# Patient Record
Sex: Female | Born: 1960 | Race: Black or African American | Hispanic: No | Marital: Single | Smoking: Current every day smoker
Health system: Southern US, Community
[De-identification: ages and names within clinical notes are randomized; demographics above are authoritative.]

## PROBLEM LIST (undated history)

## (undated) DIAGNOSIS — F329 Major depressive disorder, single episode, unspecified: Secondary | ICD-10-CM

## (undated) DIAGNOSIS — H9190 Unspecified hearing loss, unspecified ear: Secondary | ICD-10-CM

## (undated) DIAGNOSIS — J449 Chronic obstructive pulmonary disease, unspecified: Secondary | ICD-10-CM

## (undated) DIAGNOSIS — F419 Anxiety disorder, unspecified: Secondary | ICD-10-CM

## (undated) DIAGNOSIS — F32A Depression, unspecified: Secondary | ICD-10-CM

## (undated) DIAGNOSIS — B20 Human immunodeficiency virus [HIV] disease: Secondary | ICD-10-CM

## (undated) DIAGNOSIS — Z21 Asymptomatic human immunodeficiency virus [HIV] infection status: Secondary | ICD-10-CM

## (undated) HISTORY — DX: Unspecified hearing loss, unspecified ear: H91.90

## (undated) HISTORY — DX: Depression, unspecified: F32.A

## (undated) HISTORY — DX: Anxiety disorder, unspecified: F41.9

## (undated) HISTORY — DX: Major depressive disorder, single episode, unspecified: F32.9

---

## 2016-08-03 ENCOUNTER — Emergency Department: Payer: Medicare HMO

## 2016-08-03 ENCOUNTER — Emergency Department
Admission: EM | Admit: 2016-08-03 | Discharge: 2016-08-03 | Disposition: A | Discharge status: Home / Self Care | Payer: Medicare HMO | Attending: Student in an Organized Health Care Education/Training Program | Admitting: Student in an Organized Health Care Education/Training Program

## 2016-08-03 DIAGNOSIS — R079 Chest pain, unspecified: Secondary | ICD-10-CM

## 2016-08-03 DIAGNOSIS — J209 Acute bronchitis, unspecified: Secondary | ICD-10-CM

## 2016-08-03 DIAGNOSIS — R0602 Shortness of breath: Secondary | ICD-10-CM | POA: Diagnosis present

## 2016-08-03 DIAGNOSIS — J440 Chronic obstructive pulmonary disease with acute lower respiratory infection: Secondary | ICD-10-CM | POA: Diagnosis not present

## 2016-08-03 LAB — CBC
HCT: 37.7 % (ref 35.0–47.0)
Hemoglobin: 12.8 g/dL (ref 12.0–16.0)
MCH: 31.5 pg (ref 26.0–34.0)
MCHC: 33.9 g/dL (ref 32.0–36.0)
MCV: 92.8 fL (ref 80.0–100.0)
PLATELETS: 245 10*3/uL (ref 150–440)
RBC: 4.06 MIL/uL (ref 3.80–5.20)
RDW: 13.9 % (ref 11.5–14.5)
WBC: 5.8 10*3/uL (ref 3.6–11.0)

## 2016-08-03 LAB — BASIC METABOLIC PANEL
Anion gap: 5 (ref 5–15)
BUN: 10 mg/dL (ref 6–20)
CHLORIDE: 103 mmol/L (ref 101–111)
CO2: 27 mmol/L (ref 22–32)
CREATININE: 0.69 mg/dL (ref 0.44–1.00)
Calcium: 9.1 mg/dL (ref 8.9–10.3)
Glucose, Bld: 94 mg/dL (ref 65–99)
POTASSIUM: 4.2 mmol/L (ref 3.5–5.1)
SODIUM: 135 mmol/L (ref 135–145)

## 2016-08-03 LAB — TROPONIN I: Troponin I: <0.03 ng/mL (ref <0.03)

## 2016-08-03 IMAGING — CT CT ANGIO CHEST
2 of 6 series · 19 of 46 positions shown · IV contrast (APPLIED)
Comparison: None.

CLINICAL DATA: Chest pain for 2 days

EXAM:
CT ANGIOGRAPHY CHEST WITH CONTRAST
TECHNIQUE: Multidetector CT imaging of the chest was performed using the
standard protocol during bolus administration of intravenous
contrast. Multiplanar CT image reconstructions and MIPs were
obtained to evaluate the vascular anatomy.
CONTRAST:  75 cc of Isovue 370

[Series 5: thins · axial · 0.59mm/px · z∈[-673,-417]mm · 17 of 282 slices shown]
[im 13/282  lung]
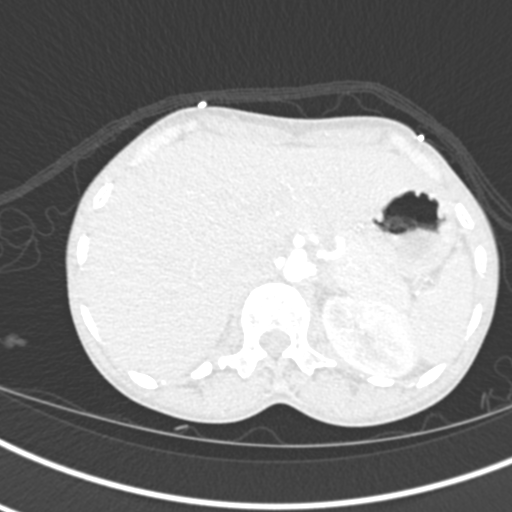
[im 25/282  soft-tissue]
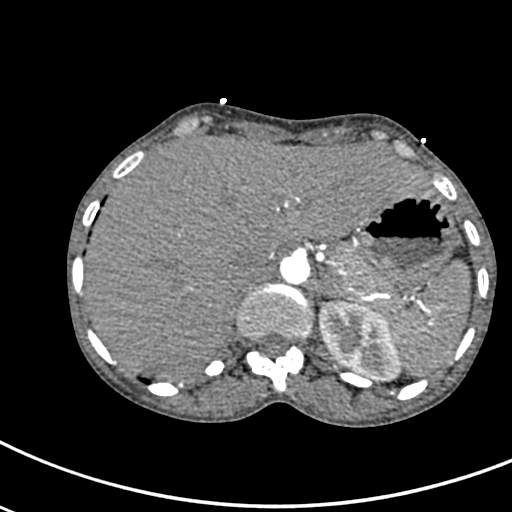
[im 49/282  lung]
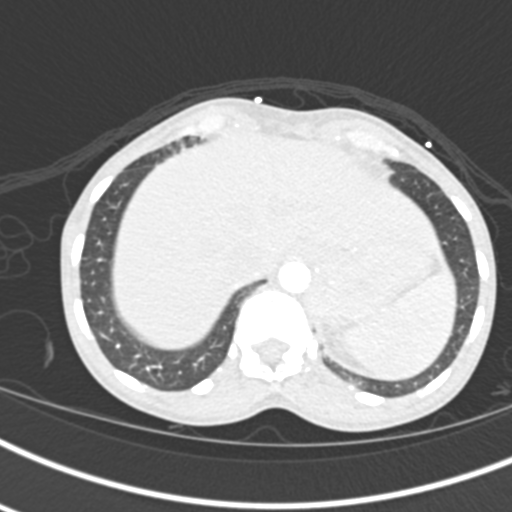
[im 62/282  soft-tissue]
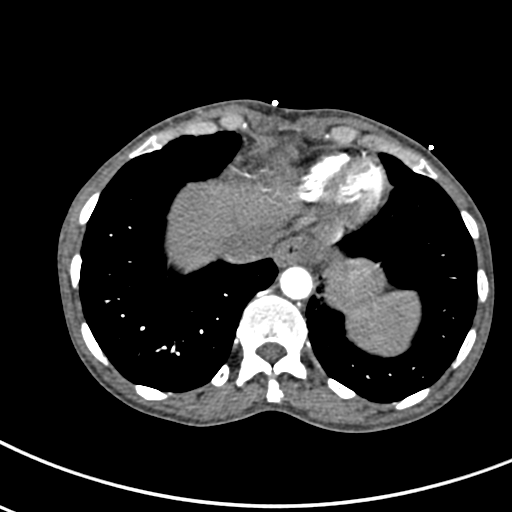
[im 74/282  lung]
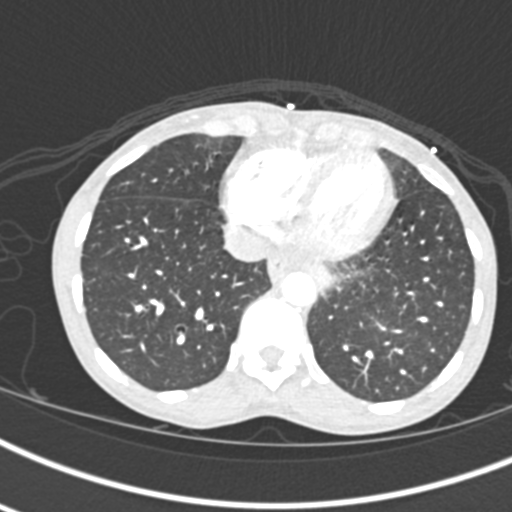
[im 98/282  soft-tissue]
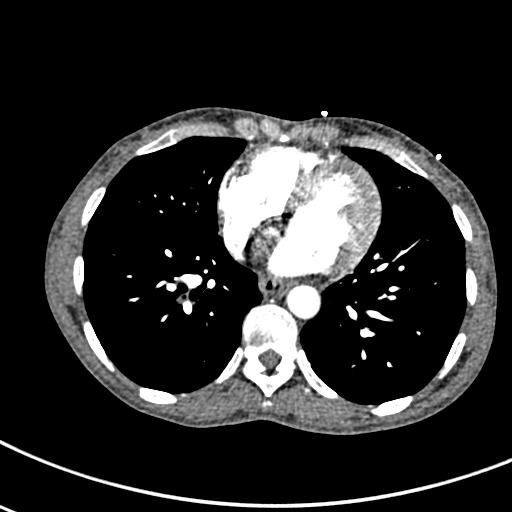
[im 110/282  lung]
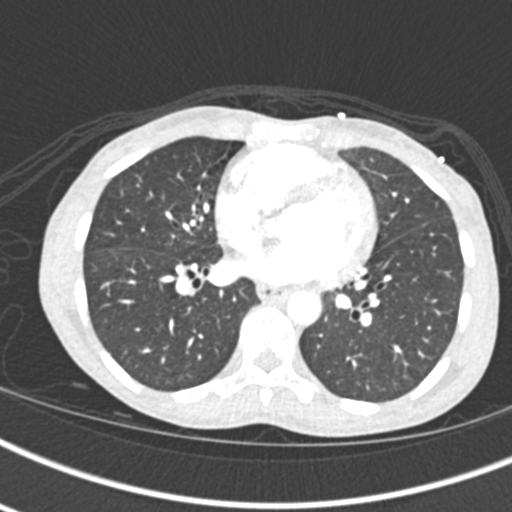
[im 123/282  soft-tissue]
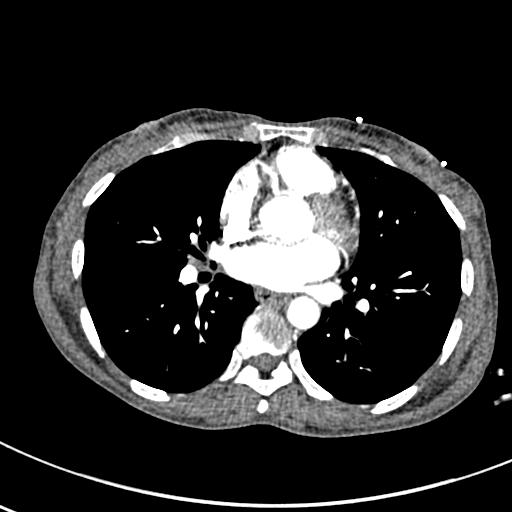
[im 147/282  lung]
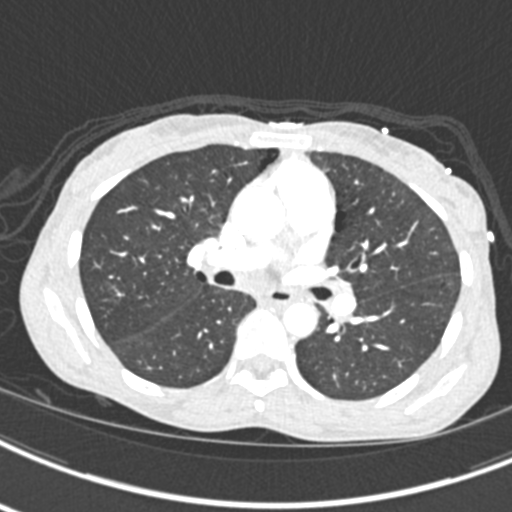
[im 159/282  soft-tissue]
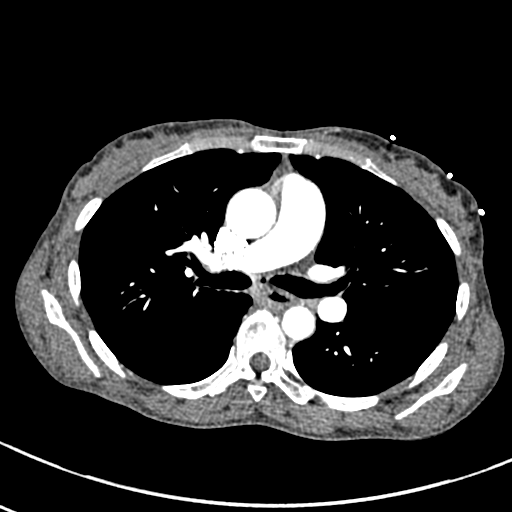
[im 172/282  lung]
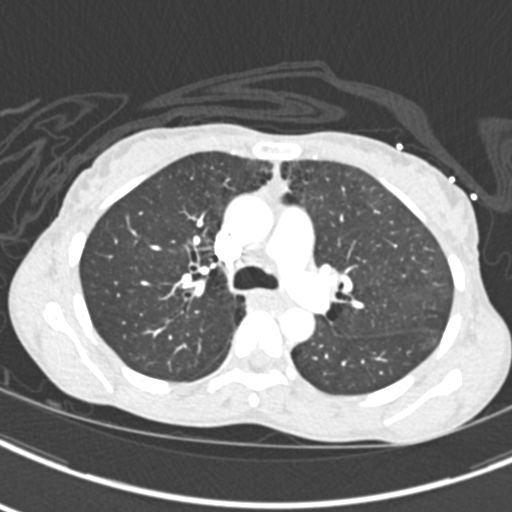
[im 184/282  soft-tissue]
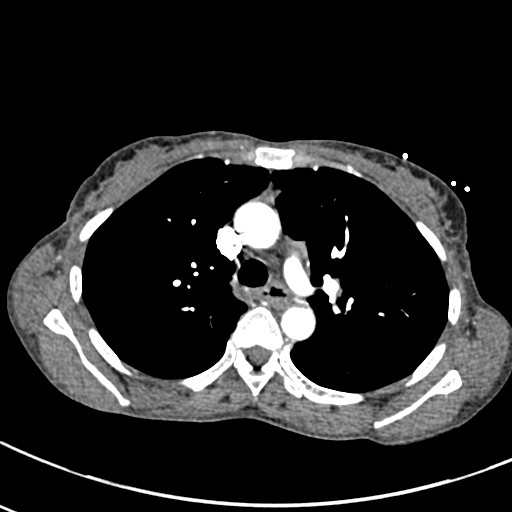
[im 208/282  lung]
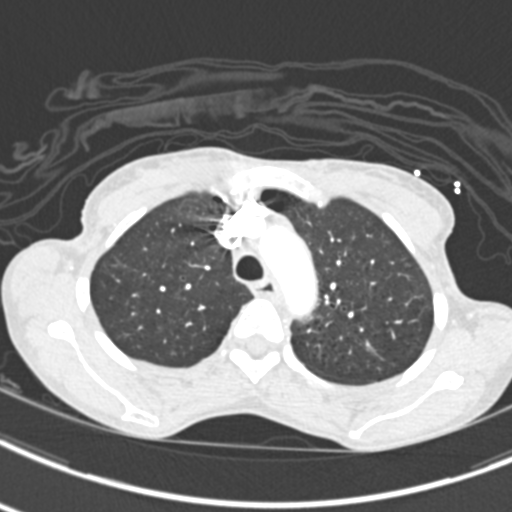
[im 220/282  soft-tissue]
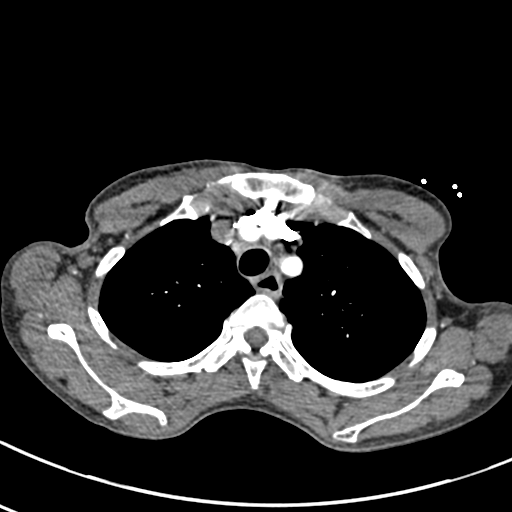
[im 233/282  lung]
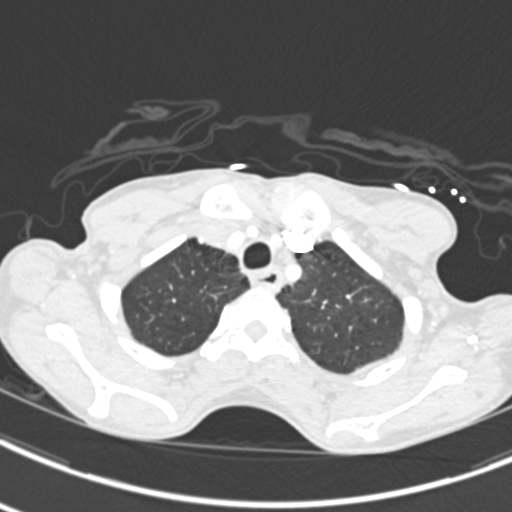
[im 257/282  soft-tissue]
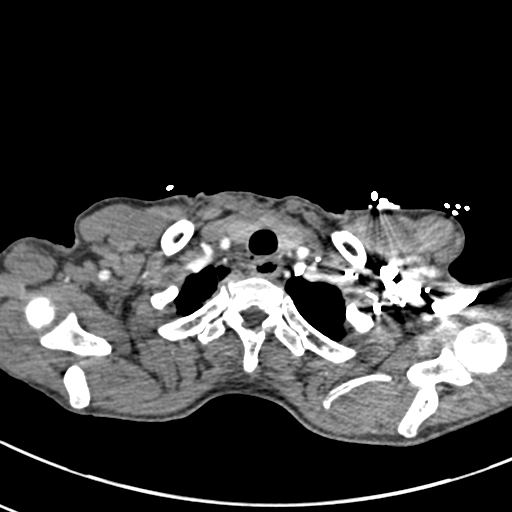
[im 269/282  lung]
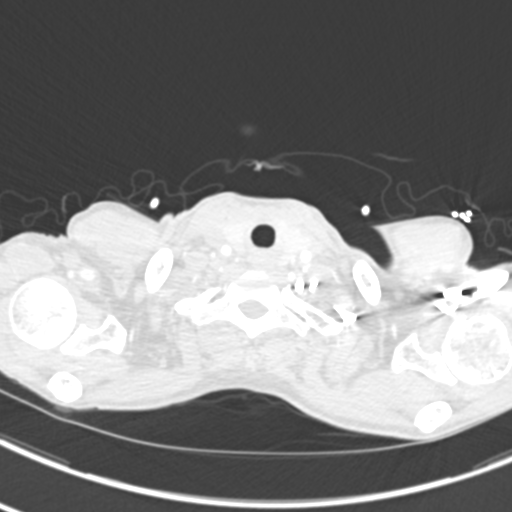

[Series 7: coronal mpr · coronal · 0.53mm/px · 2 of 67 slices shown]
[im 23/67  soft-tissue]
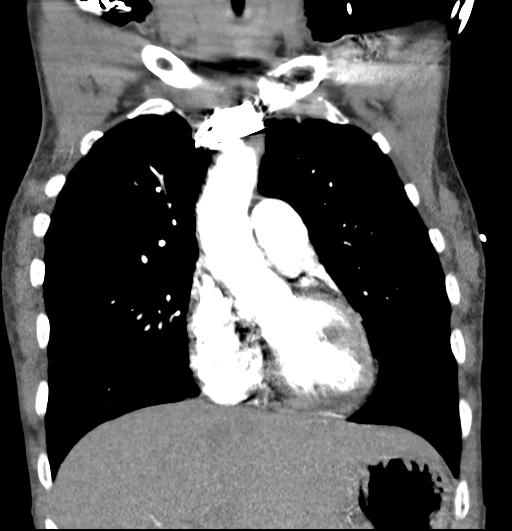
[im 45/67  soft-tissue]
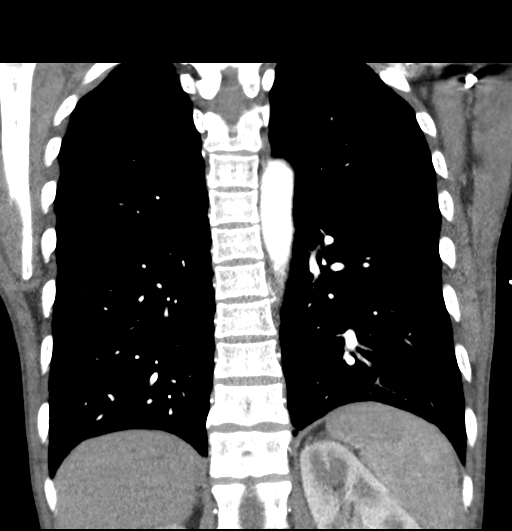

[19 of 46 positions shown; findings below may reference images not displayed]

FINDINGS: Cardiovascular: Satisfactory opacification of the pulmonary arteries
to the segmental level. No evidence of pulmonary embolism. Normal
heart size. No pericardial effusion.

Mediastinum/Nodes: The trachea appears patent and is midline. Normal
appearance of the esophagus. No mediastinal or hilar adenopathy. No
axillary or supraclavicular adenopathy. Within the posterior
mediastinum there is a 1.1 x 1.55 1.7 cm soft tissue attenuating
nodular structure, image 69 of series 4 and image number 53 of
series 9. This may represent a subpleural nodule or posterior
mediastinal lymph node.

Lungs/Pleura: No pleural fluid identified. Mild changes of
centrilobular and paraseptal emphysema. No airspace consolidation.
No pulmonary edema. Mild diffuse bronchial wall thickening.

Upper Abdomen: No acute abnormality.

Musculoskeletal: Evidence of avascular necrosis within both humeral
heads noted.

Review of the MIP images confirms the above findings.
IMPRESSION: 1. No evidence for acute pulmonary embolus.
2. Diffuse bronchial wall thickening with emphysema, as above;
imaging findings suggestive of underlying COPD.
3. There is an indeterminate soft tissue attenuating structure
within the posterior mediastinum. Although this may represent a
benign abnormality, given the patient's risk factors as well as 40
pound weight loss further investigation with PET-CT is advised to
assess for underlying malignancy.
4. Avascular necrosis involves both humeral heads

## 2016-08-03 MED ORDER — ALBUTEROL SULFATE HFA 108 (90 BASE) MCG/ACT IN AERS: 2.0000 | INHALATION_SPRAY | Freq: Four times a day (QID) | RESPIRATORY_TRACT | 2 refills | Status: DC | PRN

## 2016-08-03 MED ORDER — METHYLPREDNISOLONE SODIUM SUCC 125 MG IJ SOLR
125.0000 mg | Freq: Once | INTRAMUSCULAR | Status: AC
  Administered 2016-08-03: 125 mg via INTRAVENOUS
  Filled 2016-08-03: qty 2

## 2016-08-03 MED ORDER — PREDNISONE 20 MG PO TABS: 40.0000 mg | ORAL_TABLET | Freq: Every day | ORAL | 0 refills | Status: AC

## 2016-08-03 MED ORDER — IPRATROPIUM-ALBUTEROL 0.5-2.5 (3) MG/3ML IN SOLN
3.0000 mL | Freq: Once | RESPIRATORY_TRACT | Status: AC
  Administered 2016-08-03: 3 mL via RESPIRATORY_TRACT
  Filled 2016-08-03: qty 3

## 2016-08-03 MED ORDER — IOPAMIDOL (ISOVUE-370) INJECTION 76%
75.0000 mL | Freq: Once | INTRAVENOUS | Status: AC | PRN
  Administered 2016-08-03: 75 mL via INTRAVENOUS

## 2016-08-03 MED ORDER — FENTANYL CITRATE (PF) 100 MCG/2ML IJ SOLN: 100.0000 ug | INTRAMUSCULAR | Status: DC | PRN

## 2016-08-03 MED ORDER — SODIUM CHLORIDE 0.9 % IV BOLUS (SEPSIS)
1000.0000 mL | Freq: Once | INTRAVENOUS | Status: AC
  Administered 2016-08-03: 1000 mL via INTRAVENOUS

## 2016-08-03 MED ORDER — ALBUTEROL SULFATE (2.5 MG/3ML) 0.083% IN NEBU
5.0000 mg | INHALATION_SOLUTION | Freq: Once | RESPIRATORY_TRACT | Status: AC
  Administered 2016-08-03: 5 mg via RESPIRATORY_TRACT
  Filled 2016-08-03: qty 6

## 2016-08-03 NOTE — ED Provider Notes (Signed)
[facility] Emergency Department Provider Note    None    (approximate)  I have reviewed the triage vital signs and the nursing notes.   HISTORY  Chief Complaint Chest Pain    HPI Patricia Luna is a 55 y.o. female with no previous history of known heart disease or lung disease presents with shortness of breath and chest pain that acutely worsened while at church today. Patient states his symptoms have been worsening over the past 3 days. States that she was at church felt overwhelmed with worsening shortness of breath and inability to catch her breath. States she had a pressure in the middle of her chest without radiation through to her back. She was not diaphoretic and there is no nausea. She does have smoking history.  His neuro and told that she has a diagnosis of COPD or asthma. No previous history of blood clots or pulmonary embolism. No known cancer.   No known history of COPD. No family history of sickle cell or bleeding disorders No recent surgery There are no active problems to display for this patient.     Prior to Admission medications   Medication Sig Start Date End Date Taking? Authorizing Provider  albuterol (PROVENTIL HFA;VENTOLIN HFA) 108 (90 Base) MCG/ACT inhaler Inhale 2 puffs into the lungs every 6 (six) hours as needed for wheezing or shortness of breath. 08/03/16   Willy EddyPatrick Tanay Misuraca, MD  predniSONE (DELTASONE) 20 MG tablet Take 2 tablets (40 mg total) by mouth daily. 08/03/16 08/08/16  Willy EddyPatrick Aniken Monestime, MD    Allergies Patient has no known allergies.    Social History Social History  Substance Use Topics  . Smoking status: Not on file  . Smokeless tobacco: Not on file  . Alcohol use Not on file    Review of Systems Patient denies headaches, rhinorrhea, blurry vision, numbness, shortness of breath, chest pain, edema, cough, abdominal pain, nausea, vomiting, diarrhea, dysuria, fevers, rashes or hallucinations unless  otherwise stated above in HPI. ____________________________________________   PHYSICAL EXAM:  VITAL SIGNS: Vitals:   08/03/16 1330 08/03/16 1400  BP: (!) 126/104 120/74  Pulse: (!) 102 (!) 112  Resp: (!) 28 (!) 24  Temp:      Constitutional: Alert and oriented. Anxious appearing in no acute distress. Eyes: Conjunctivae are normal. PERRL. EOMI. Head: Atraumatic. Nose: No congestion/rhinnorhea. Mouth/Throat: Mucous membranes are moist.  Oropharynx non-erythematous. Neck: No stridor. Painless ROM. No cervical spine tenderness to palpation Hematological/Lymphatic/Immunilogical: No cervical lymphadenopathy. Cardiovascular: Normal rate, regular rhythm. Grossly normal heart sounds.  Good peripheral circulation. Respiratory: Tachypnea with prolonged expiratory phase and diminished breath sounds bilaterally Gastrointestinal: Soft and nontender. No distention. No abdominal bruits. No CVA tenderness.  Musculoskeletal: No lower extremity tenderness nor edema.  No joint effusions. Neurologic:  Normal speech and language. No gross focal neurologic deficits are appreciated. No gait instability. Skin:  Skin is warm, dry and intact. No rash noted. Psychiatric: Mood and affect are normal. Speech and behavior are normal.  ____________________________________________   LABS (all labs ordered are listed, but only abnormal results are displayed)  Results for orders placed or performed during the hospital encounter of 08/03/16 (from the past 24 hour(s))  Basic metabolic panel     Status: None   Collection Time: 08/03/16 11:44 AM  Result Value Ref Range   Sodium 135 135 - 145 mmol/L   Potassium 4.2 3.5 - 5.1 mmol/L   Chloride 103 101 - 111 mmol/L   CO2 27 22 - 32 mmol/L  Glucose, Bld 94 65 - 99 mg/dL   BUN 10 6 - 20 mg/dL   Creatinine, Ser 1.61 0.44 - 1.00 mg/dL   Calcium 9.1 8.9 - 09.6 mg/dL   GFR calc non Af Amer >60 >60 mL/min   GFR calc Af Amer >60 >60 mL/min   Anion gap 5 5 - 15  CBC      Status: None   Collection Time: 08/03/16 11:44 AM  Result Value Ref Range   WBC 5.8 3.6 - 11.0 K/uL   RBC 4.06 3.80 - 5.20 MIL/uL   Hemoglobin 12.8 12.0 - 16.0 g/dL   HCT 04.5 40.9 - 81.1 %   MCV 92.8 80.0 - 100.0 fL   MCH 31.5 26.0 - 34.0 pg   MCHC 33.9 32.0 - 36.0 g/dL   RDW 91.4 78.2 - 95.6 %   Platelets 245 150 - 440 K/uL  Troponin I     Status: None   Collection Time: 08/03/16 11:44 AM  Result Value Ref Range   Troponin I <0.03 <0.03 ng/mL  Troponin I     Status: None   Collection Time: 08/03/16  2:10 PM  Result Value Ref Range   Troponin I <0.03 <0.03 ng/mL   ____________________________________________  EKG My review and personal interpretation at Time: 11:39   Indication: chest pain  Rate: 110  Rhythm: sinus Axis: normal Other: BAE, non specific ST changes, normal intervals ____________________________________________  RADIOLOGY  I personally reviewed all radiographic images ordered to evaluate for the above acute complaints and reviewed radiology reports and findings.  These findings were personally discussed with the patient.  Please see medical record for radiology report.  ____________________________________________   PROCEDURES  Procedure(s) performed: none Procedures    Critical Care performed: no ____________________________________________   INITIAL IMPRESSION / ASSESSMENT AND PLAN / ED COURSE  Pertinent labs & imaging results that were available during my care of the patient were reviewed by me and considered in my medical decision making (see chart for details).  DDX: renal failure, PE, sepsis, acs, malignancy  Patricia Luna is a 55 y.o. who presents to the ED with Complaint of chest pain and shortness of breath. Patient arrives very anxious appearing but in no acute distress. Exam with diminished breath sounds and occasional wheezing suggesting evidence of COPD though given patient's tachycardia and dyspnea will order CT imaging to  evaluate for PE versus mass. No evidence of acute ischemia on EKG there is tachycardia. Patient is otherwise low risk by heart score based on history. We will plan on getting 2 troponins to further risk stratify.  The patient will be placed on continuous pulse oximetry and telemetry for monitoring.  Laboratory evaluation will be sent to evaluate for the above complaints.     Clinical Course as of Aug 03 1524  Wynelle Link Aug 03, 2016  1338 Patient reassessed and denies any chest pain at this time symptoms significant improvement after albuterol treatments. CT imaging does not show any evidence of mass or pulmonary embolism but does show diffuse bronchial wall thickening concerning for emphysema versus COPD. We'll give IV steroids and reassessed.  [PR]  1411 Patient able to ambulate without distress.  No chest pain or SOB.  Will repeat trop to further risk stratify.  [PR]  1511 Repeat trop negative.  Discussed need for close out patient follow up.  [PR]    Clinical Course User Index [PR] Willy Eddy, MD     ____________________________________________   FINAL CLINICAL IMPRESSION(S) / ED DIAGNOSES  Final diagnoses:  COPD (chronic obstructive pulmonary disease) with acute bronchitis (HCC)  Chest pain, unspecified type      NEW MEDICATIONS STARTED DURING THIS VISIT:  New Prescriptions   ALBUTEROL (PROVENTIL HFA;VENTOLIN HFA) 108 (90 BASE) MCG/ACT INHALER    Inhale 2 puffs into the lungs every 6 (six) hours as needed for wheezing or shortness of breath.   PREDNISONE (DELTASONE) 20 MG TABLET    Take 2 tablets (40 mg total) by mouth daily.     Note:  This document was prepared using Dragon voice recognition software and may include unintentional dictation errors.    Willy EddyPatrick Elizabeht Suto, MD 08/03/16 [phone]

## 2016-08-03 NOTE — ED Notes (Signed)
Pulse ox 99 - 100% walking. Denies pain and sob

## 2016-08-03 NOTE — ED Triage Notes (Signed)
Patient from home via POV. C/P for 2 days. Patient states nothing makes it better or worse however it "just gets bad on its own"

## 2016-09-17 ENCOUNTER — Emergency Department: Payer: Medicare Other

## 2016-09-17 ENCOUNTER — Encounter: Payer: Self-pay | Admitting: Emergency Medicine

## 2016-09-17 ENCOUNTER — Emergency Department
Admission: EM | Admit: 2016-09-17 | Discharge: 2016-09-17 | Disposition: A | Discharge status: Home / Self Care | Payer: Medicare Other | Attending: Emergency Medicine | Admitting: Emergency Medicine

## 2016-09-17 DIAGNOSIS — J449 Chronic obstructive pulmonary disease, unspecified: Secondary | ICD-10-CM | POA: Diagnosis not present

## 2016-09-17 DIAGNOSIS — R0602 Shortness of breath: Secondary | ICD-10-CM | POA: Diagnosis not present

## 2016-09-17 DIAGNOSIS — Z87891 Personal history of nicotine dependence: Secondary | ICD-10-CM | POA: Diagnosis not present

## 2016-09-17 DIAGNOSIS — R0600 Dyspnea, unspecified: Secondary | ICD-10-CM

## 2016-09-17 HISTORY — DX: Human immunodeficiency virus (HIV) disease: B20

## 2016-09-17 HISTORY — DX: Asymptomatic human immunodeficiency virus (hiv) infection status: Z21

## 2016-09-17 HISTORY — DX: Chronic obstructive pulmonary disease, unspecified: J44.9

## 2016-09-17 LAB — CBC
HCT: 39.2 % (ref 35.0–47.0)
HEMOGLOBIN: 12.9 g/dL (ref 12.0–16.0)
MCH: 31.1 pg (ref 26.0–34.0)
MCHC: 33 g/dL (ref 32.0–36.0)
MCV: 94.2 fL (ref 80.0–100.0)
Platelets: 303 10*3/uL (ref 150–440)
RBC: 4.16 MIL/uL (ref 3.80–5.20)
RDW: 13.5 % (ref 11.5–14.5)
WBC: 6.6 10*3/uL (ref 3.6–11.0)

## 2016-09-17 LAB — BASIC METABOLIC PANEL
ANION GAP: 8 (ref 5–15)
BUN: 12 mg/dL (ref 6–20)
CHLORIDE: 105 mmol/L (ref 101–111)
CO2: 23 mmol/L (ref 22–32)
Calcium: 8.9 mg/dL (ref 8.9–10.3)
Creatinine, Ser: 0.72 mg/dL (ref 0.44–1.00)
GFR calc non Af Amer: >60 mL/min (ref >60)
Glucose, Bld: 88 mg/dL (ref 65–99)
POTASSIUM: 4.3 mmol/L (ref 3.5–5.1)
Sodium: 136 mmol/L (ref 135–145)

## 2016-09-17 NOTE — ED Triage Notes (Signed)
Pt here because has lost 30lbs in last 3 months and because feels like throat is swollen from medication for her COPD.  Is HIV positive but has not been taking her meds in last 2 years but would like to get back on them. [facility] but appears mildly labored when talking in triage. Has had dry cough. Was given ventolin and last used 3 days ago but still feels like throat swollen. No dyspnea at this time. C/o throat is sore. handling secretion and has some type lozenge in mouth.

## 2016-09-17 NOTE — Discharge Instructions (Signed)
Please call the number provided for Dr. Sampson GoonFitzgerald to be seen by an infectious disease physician to reestablish care and for further HIV management.

## 2016-09-17 NOTE — ED Provider Notes (Signed)
[facility] Emergency Department Provider Note  Time seen: 5:10 PM  I have reviewed the triage vital signs and the nursing notes.   HISTORY  Chief Complaint Shortness of Breath    HPI Patricia Luna is a 55 y.o. female with a past medical history of COPD, HIV who presents to the emergency department for throat swelling. According to the patient she has a history of COPD approximately 1 month ago she was having shortness of breath and was prescribed Ventolin. Patient states every time she takes the Ventolin she has a sensation that her throat is closing up. Patient felt like this this morning so she came to the emergency department for evaluation. Currently she denies any shortness of breath, denies any current swelling. Patient states she is HIV positive and has been off of her medications for quite some time and is interested in getting restarted on her medications. Patient also notes significant weight loss over the past several months. Denies any fever, cough or congestion. Does state intermittent shortness of breath which she relates to her COPD currently 100% on room air. Patient denies any chest pain. No distress, answering questions and following commands appropriately.  Past Medical History:  Diagnosis Date  . COPD (chronic obstructive pulmonary disease) (HCC)   . HIV (human immunodeficiency virus infection) (HCC)     There are no active problems to display for this patient.   Past Surgical History:  Procedure Laterality Date  . CESAREAN SECTION      Prior to Admission medications   Medication Sig Start Date End Date Taking? Authorizing Provider  albuterol (PROVENTIL HFA;VENTOLIN HFA) 108 (90 Base) MCG/ACT inhaler Inhale 2 puffs into the lungs every 6 (six) hours as needed for wheezing or shortness of breath. 08/03/16   Willy EddyPatrick Robinson, MD    Allergies  Allergen Reactions  . Other     A shot they gave me and I wound up in ICU  . Ventolin  [Albuterol] Swelling    History reviewed. No pertinent family history.  Social History Social History  Substance Use Topics  . Smoking status: Former Smoker    Quit date: 08/18/2016  . Smokeless tobacco: Never Used  . Alcohol use No    Review of Systems Constitutional: Negative for fever. Cardiovascular: Negative for chest pain. Respiratory: Intermittent shortness of breath, none currently. Gastrointestinal: Negative for abdominal pain Musculoskeletal: Negative for back pain Neurological: Negative for headache 10-point ROS otherwise negative.  ____________________________________________   PHYSICAL EXAM:  VITAL SIGNS: ED Triage Vitals [09/17/16 1414]  Enc Vitals Group     BP (!) 142/91     Pulse Rate 97     Resp (!) 24     Temp 98.5 F (36.9 C)     Temp Source Oral     SpO2 99 %     Weight 98 lb (44.5 kg)     Height 5\' 3"  (1.6 m)     Head Circumference      Peak Flow      Pain Score 8     Pain Loc      Pain Edu?      Excl. in GC?     Constitutional: Alert and oriented. Well appearing and in no distress. Eyes: Normal exam ENT   Head: Normocephalic and atraumatic.   Mouth/Throat: Mucous membranes are moist. Cardiovascular: Normal rate, regular rhythm. No murmur Respiratory: Normal respiratory effort without tachypnea nor retractions. Breath sounds are clear  Gastrointestinal: Soft and nontender. No distention Musculoskeletal:  Nontender with normal range of motion in all extremities. Neurologic:  Normal speech and language. No gross focal neurologic deficits Skin:  Skin is warm, dry and intact.  Psychiatric: Mood and affect are normal.   ____________________________________________    EKG  EKG reviewed and interpreted by myself shows normal sinus rhythm at 87 bpm, narrow QRS, normal axis, normal intervals with no concerning ST changes. Overall normal EKG.  ____________________________________________    RADIOLOGY  Chest x-ray  negative  ____________________________________________   INITIAL IMPRESSION / ASSESSMENT AND PLAN / ED COURSE  Pertinent labs & imaging results that were available during my care of the patient were reviewed by me and considered in my medical decision making (see chart for details).  Patient presents the emergency department with complaints of difficulty swallowing and throat swelling when she takes Ventolin. On examination the patient appears well, no distress, follows commands appropriately. No oral edema noted, very patent oral pharynx. No signs of thrush. Patient is supposed to be on HIV medications but has not been for some time and she states she recently moved to the area and has not established care yet. Currently the patient appears well, labs largely within normal limits. Chest x-ray is negative. I discussed with the patient a trial of not using the Ventolin. I will refer the patient to infectious disease for further evaluation and to restart her medications. Patient is agreeable to this plan.  ____________________________________________   FINAL CLINICAL IMPRESSION(S) / ED DIAGNOSES  Dyspnea     Minna AntisKevin Destyne Goodreau, MD 09/17/16 1715

## 2016-09-17 NOTE — ED Notes (Signed)
EDP at bedside  

## 2016-09-22 HISTORY — PX: COLONOSCOPY: SHX174

## 2017-11-17 ENCOUNTER — Emergency Department: Payer: Medicare HMO

## 2017-11-17 ENCOUNTER — Encounter: Payer: Self-pay | Admitting: Intensive Care

## 2017-11-17 ENCOUNTER — Emergency Department
Admission: EM | Admit: 2017-11-17 | Discharge: 2017-11-17 | Disposition: A | Discharge status: Home / Self Care | Payer: Medicare HMO | Attending: Student in an Organized Health Care Education/Training Program | Admitting: Student in an Organized Health Care Education/Training Program

## 2017-11-17 DIAGNOSIS — R0789 Other chest pain: Secondary | ICD-10-CM | POA: Diagnosis not present

## 2017-11-17 DIAGNOSIS — Z87891 Personal history of nicotine dependence: Secondary | ICD-10-CM | POA: Diagnosis not present

## 2017-11-17 DIAGNOSIS — J449 Chronic obstructive pulmonary disease, unspecified: Secondary | ICD-10-CM | POA: Diagnosis not present

## 2017-11-17 DIAGNOSIS — B20 Human immunodeficiency virus [HIV] disease: Secondary | ICD-10-CM | POA: Diagnosis not present

## 2017-11-17 DIAGNOSIS — J40 Bronchitis, not specified as acute or chronic: Secondary | ICD-10-CM

## 2017-11-17 DIAGNOSIS — R079 Chest pain, unspecified: Secondary | ICD-10-CM | POA: Diagnosis present

## 2017-11-17 LAB — BASIC METABOLIC PANEL
Anion gap: 9 (ref 5–15)
CALCIUM: 8.5 mg/dL — AB (ref 8.9–10.3)
CO2: 21 mmol/L — ABNORMAL LOW (ref 22–32)
Chloride: 101 mmol/L (ref 101–111)
Creatinine, Ser: 0.61 mg/dL (ref 0.44–1.00)
GFR calc non Af Amer: >60 mL/min (ref >60)
Glucose, Bld: 116 mg/dL — ABNORMAL HIGH (ref 65–99)
Potassium: 3.7 mmol/L (ref 3.5–5.1)
SODIUM: 131 mmol/L — AB (ref 135–145)

## 2017-11-17 LAB — FIBRIN DERIVATIVES D-DIMER (ARMC ONLY): Fibrin derivatives D-dimer (ARMC): 516.68 ng/mL (FEU) — ABNORMAL HIGH (ref 0.00–499.00)

## 2017-11-17 LAB — TROPONIN I

## 2017-11-17 LAB — CBC
HCT: 40.1 % (ref 35.0–47.0)
Hemoglobin: 13.4 g/dL (ref 12.0–16.0)
MCH: 31.6 pg (ref 26.0–34.0)
MCHC: 33.5 g/dL (ref 32.0–36.0)
MCV: 94.2 fL (ref 80.0–100.0)
PLATELETS: 219 10*3/uL (ref 150–440)
RBC: 4.26 MIL/uL (ref 3.80–5.20)
RDW: 14.5 % (ref 11.5–14.5)
WBC: 5.3 10*3/uL (ref 3.6–11.0)

## 2017-11-17 MED ORDER — ALBUTEROL SULFATE HFA 108 (90 BASE) MCG/ACT IN AERS: 2.0000 | INHALATION_SPRAY | Freq: Four times a day (QID) | RESPIRATORY_TRACT | 2 refills | Status: DC | PRN

## 2017-11-17 MED ORDER — IOPAMIDOL (ISOVUE-370) INJECTION 76%
75.0000 mL | Freq: Once | INTRAVENOUS | Status: AC | PRN
  Administered 2017-11-17: 75 mL via INTRAVENOUS

## 2017-11-17 MED ORDER — DOXYCYCLINE HYCLATE 100 MG PO TABS
100.0000 mg | ORAL_TABLET | Freq: Once | ORAL | Status: AC
  Administered 2017-11-17: 100 mg via ORAL
  Filled 2017-11-17: qty 1

## 2017-11-17 MED ORDER — IPRATROPIUM-ALBUTEROL 0.5-2.5 (3) MG/3ML IN SOLN
3.0000 mL | Freq: Once | RESPIRATORY_TRACT | Status: AC
  Administered 2017-11-17: 3 mL via RESPIRATORY_TRACT
  Filled 2017-11-17: qty 3

## 2017-11-17 MED ORDER — HYDROCODONE-ACETAMINOPHEN 5-325 MG PO TABS
1.0000 | ORAL_TABLET | Freq: Once | ORAL | Status: AC
  Administered 2017-11-17: 1 via ORAL
  Filled 2017-11-17: qty 1

## 2017-11-17 MED ORDER — SODIUM CHLORIDE 0.9 % IV BOLUS (SEPSIS)
500.0000 mL | Freq: Once | INTRAVENOUS | Status: AC
  Administered 2017-11-17: 500 mL via INTRAVENOUS

## 2017-11-17 MED ORDER — DOXYCYCLINE HYCLATE 100 MG PO TABS: 100.0000 mg | ORAL_TABLET | Freq: Two times a day (BID) | ORAL | 0 refills | Status: AC

## 2017-11-17 MED ORDER — PREDNISONE 20 MG PO TABS
60.0000 mg | ORAL_TABLET | Freq: Once | ORAL | Status: AC
  Administered 2017-11-17: 60 mg via ORAL
  Filled 2017-11-17: qty 3

## 2017-11-17 MED ORDER — PREDNISONE 20 MG PO TABS: 40.0000 mg | ORAL_TABLET | Freq: Every day | ORAL | 0 refills | Status: AC

## 2017-11-17 NOTE — ED Notes (Signed)
First Nurse Note:  Patient complaining of left sided chest pain since last PM.

## 2017-11-17 NOTE — ED Triage Notes (Signed)
Patient c/o sharp left sided chest pain that started last night. A&O x4. Pt hard of hearing

## 2017-11-17 NOTE — ED Provider Notes (Signed)
[facility] Emergency Department Provider Note    None    (approximate)  I have reviewed the triage vital signs and the nursing notes.   HISTORY  Chief Complaint Chest Pain    HPI Patricia Luna is a 57 y.o. female a history of COPD as well as H to V presents with 24 hours of left-sided chest pain that awoke her from sleep.  States that pain is intermittent.  Worse with taking deep inspiration.  Denies any lower extremity swelling.  No history of blood clots.  Not on any blood thinners.  States she is been compliant with her HIV therapy.  Denies any fevers cough.  His primary concern is pain in the chest.  Denies any abdominal pain nausea or vomiting.  Past Medical History:  Diagnosis Date  . COPD (chronic obstructive pulmonary disease) (HCC)   . HIV (human immunodeficiency virus infection) (HCC)    History reviewed. No pertinent family history. Past Surgical History:  Procedure Laterality Date  . CESAREAN SECTION     There are no active problems to display for this patient.     Prior to Admission medications   Medication Sig Start Date End Date Taking? Authorizing Provider  albuterol (PROVENTIL HFA;VENTOLIN HFA) 108 (90 Base) MCG/ACT inhaler Inhale 2 puffs into the lungs every 6 (six) hours as needed for wheezing or shortness of breath. 08/03/16   Willy Eddy, MD    Allergies Other and Ventolin [albuterol]    Social History Social History   Tobacco Use  . Smoking status: Former Smoker    Last attempt to quit: 08/18/2016    Years since quitting: 1.2  . Smokeless tobacco: Never Used  Substance Use Topics  . Alcohol use: No  . Drug use: No    Review of Systems Patient denies headaches, rhinorrhea, blurry vision, numbness, shortness of breath, chest pain, edema, cough, abdominal pain, nausea, vomiting, diarrhea, dysuria, fevers, rashes or hallucinations unless otherwise stated above in  HPI. ____________________________________________   PHYSICAL EXAM:  VITAL SIGNS: Vitals:   11/17/17 1223  BP: 119/85  Pulse: (!) 101  Resp: 18  Temp: 98.9 F (37.2 C)  SpO2: 97%    Constitutional: Alert and oriented.  in no acute distress. Eyes: Conjunctivae are normal.  Head: Atraumatic. Nose: No congestion/rhinnorhea. Mouth/Throat: Mucous membranes are moist.   Neck: No stridor. Painless ROM.  Cardiovascular:mild tachycardia, regular rhythm. Grossly normal heart sounds.  Good peripheral circulation. Respiratory: Normal respiratory effort.  No retractions. Lungs with occasional wheeze, no rhonchi or crackles Gastrointestinal: Soft and nontender. No distention. No abdominal bruits. No CVA tenderness. Musculoskeletal: No lower extremity tenderness nor edema.  No joint effusions. Neurologic:  Normal speech and language. No gross focal neurologic deficits are appreciated. No facial droop Skin:  Skin is warm, dry and intact. No rash noted. Psychiatric: Mood and affect are normal. Speech and behavior are normal.  ____________________________________________   LABS (all labs ordered are listed, but only abnormal results are displayed)  Results for orders placed or performed during the hospital encounter of 11/17/17 (from the past 24 hour(s))  Basic metabolic panel     Status: Abnormal   Collection Time: 11/17/17 12:21 PM  Result Value Ref Range   Sodium 131 (L) 135 - 145 mmol/L   Potassium 3.7 3.5 - 5.1 mmol/L   Chloride 101 101 - 111 mmol/L   CO2 21 (L) 22 - 32 mmol/L   Glucose, Bld 116 (H) 65 - 99 mg/dL   BUN <5 (  L) 6 - 20 mg/dL   Creatinine, Ser 1.610.61 0.44 - 1.00 mg/dL   Calcium 8.5 (L) 8.9 - 10.3 mg/dL   GFR calc non Af Amer >60 >60 mL/min   GFR calc Af Amer >60 >60 mL/min   Anion gap 9 5 - 15  CBC     Status: None   Collection Time: 11/17/17 12:21 PM  Result Value Ref Range   WBC 5.3 3.6 - 11.0 K/uL   RBC 4.26 3.80 - 5.20 MIL/uL   Hemoglobin 13.4 12.0 - 16.0 g/dL    HCT 09.640.1 04.535.0 - 40.947.0 %   MCV 94.2 80.0 - 100.0 fL   MCH 31.6 26.0 - 34.0 pg   MCHC 33.5 32.0 - 36.0 g/dL   RDW 81.114.5 91.411.5 - 78.214.5 %   Platelets 219 150 - 440 K/uL  Troponin I     Status: None   Collection Time: 11/17/17 12:21 PM  Result Value Ref Range   Troponin I <0.03 <0.03 ng/mL   ____________________________________________  EKG My review and personal interpretation at Time: 12:13   Indication: chest pain  Rate: 105  Rhythm: sinus Axis: normal Other: normal intervals, no stemi  ____________________________________________  RADIOLOGY  I personally reviewed all radiographic images ordered to evaluate for the above acute complaints and reviewed radiology reports and findings.  These findings were personally discussed with the patient.  Please see medical record for radiology report.  ____________________________________________   PROCEDURES  Procedure(s) performed:  Procedures    Critical Care performed: no ____________________________________________   INITIAL IMPRESSION / ASSESSMENT AND PLAN / ED COURSE  Pertinent labs & imaging results that were available during my care of the patient were reviewed by me and considered in my medical decision making (see chart for details).  DDX: Asthma, copd, CHF, pna, ptx, malignancy, Pe, anemia   Patricia Luna is a 57 y.o. who presents to the ED with symptoms as described above.  She is low risk Wells but will order d-dimer to further risk stratify for pulmonary embolism.  EKG shows sinus tachycardia with no evidence of acute ischemia and her troponin is negative after over 24 hours of pain.  Is not clinically consistent with ACS.  Patient states that she does still smoke I do suspect some chronic component of bronchitis.  Blood work otherwise reassuring.  Review of previous records shows that her CD4 count was increasing and she is been compliant with her heart therapy therefore do not believe that she is at risk for AIDS  associated infections.  Not clinically consistent with PCP.  CT imaging ordered as d-dimer was positive she has no evidence of large pulmonary embolism.  Probable component of atypical infection and bronchitis.  Will start on antibiotics as well as inhaler and steroids.  Have discussed with the patient and available family all diagnostics and treatments performed thus far and all questions were answered to the best of my ability. The patient demonstrates understanding and agreement with plan.       ____________________________________________   FINAL CLINICAL IMPRESSION(S) / ED DIAGNOSES  Final diagnoses:  Atypical chest pain  Bronchitis      NEW MEDICATIONS STARTED DURING THIS VISIT:  New Prescriptions   No medications on file     Note:  This document was prepared using Dragon voice recognition software and may include unintentional dictation errors.    Willy Eddyobinson, Aaric Dolph, MD 11/17/17 [phone]

## 2018-02-17 ENCOUNTER — Other Ambulatory Visit: Payer: Self-pay | Admitting: Registered Nurse

## 2018-02-17 DIAGNOSIS — Z1231 Encounter for screening mammogram for malignant neoplasm of breast: Secondary | ICD-10-CM

## 2018-03-08 ENCOUNTER — Encounter: Payer: Self-pay | Admitting: Emergency Medicine

## 2018-03-08 ENCOUNTER — Other Ambulatory Visit: Payer: Self-pay

## 2018-03-08 ENCOUNTER — Emergency Department
Admission: EM | Admit: 2018-03-08 | Discharge: 2018-03-08 | Disposition: A | Discharge status: Home / Self Care | Payer: Medicare HMO | Attending: Emergency Medicine | Admitting: Emergency Medicine

## 2018-03-08 ENCOUNTER — Emergency Department: Payer: Medicare HMO

## 2018-03-08 DIAGNOSIS — Z79899 Other long term (current) drug therapy: Secondary | ICD-10-CM | POA: Insufficient documentation

## 2018-03-08 DIAGNOSIS — R05 Cough: Secondary | ICD-10-CM | POA: Diagnosis present

## 2018-03-08 DIAGNOSIS — J189 Pneumonia, unspecified organism: Secondary | ICD-10-CM

## 2018-03-08 DIAGNOSIS — J449 Chronic obstructive pulmonary disease, unspecified: Secondary | ICD-10-CM | POA: Diagnosis not present

## 2018-03-08 DIAGNOSIS — J181 Lobar pneumonia, unspecified organism: Secondary | ICD-10-CM | POA: Diagnosis not present

## 2018-03-08 DIAGNOSIS — Z87891 Personal history of nicotine dependence: Secondary | ICD-10-CM | POA: Insufficient documentation

## 2018-03-08 DIAGNOSIS — B20 Human immunodeficiency virus [HIV] disease: Secondary | ICD-10-CM | POA: Diagnosis not present

## 2018-03-08 MED ORDER — CEFTRIAXONE SODIUM 250 MG IJ SOLR
250.0000 mg | Freq: Once | INTRAMUSCULAR | Status: AC
Start: 2018-03-08 — End: 2018-03-08
  Administered 2018-03-08: 250 mg via INTRAMUSCULAR
  Filled 2018-03-08: qty 250

## 2018-03-08 MED ORDER — AZITHROMYCIN 250 MG PO TABS: ORAL_TABLET | ORAL | 0 refills | Status: DC

## 2018-03-08 MED ORDER — LIDOCAINE HCL (PF) 1 % IJ SOLN
5.0000 mL | Freq: Once | INTRAMUSCULAR | Status: AC
  Administered 2018-03-08: 0.8 mL

## 2018-03-08 MED ORDER — LIDOCAINE HCL (PF) 1 % IJ SOLN
INTRAMUSCULAR | Status: AC
  Filled 2018-03-08: qty 5

## 2018-03-08 MED ORDER — BENZONATATE 100 MG PO CAPS: ORAL_CAPSULE | ORAL | 0 refills | Status: DC

## 2018-03-08 NOTE — Discharge Instructions (Addendum)
Take the prescription antibiotic as directed. Continue to dose your daily albuterol, as needed. Take the cough medicine as needed. Return to the ED as needed.

## 2018-03-08 NOTE — ED Notes (Signed)
Says sick with cough, green sputum f or about  A week.  Pat with continual cough, especially with talking.  Has mask on.  Not sure about fever.

## 2018-03-08 NOTE — ED Triage Notes (Signed)
Cough x 1 week. Denies fevers. Chest wall pain with cough,

## 2018-03-08 NOTE — ED Notes (Signed)
On some new meds, not sure what they are.

## 2018-03-08 NOTE — ED Provider Notes (Signed)
[facility] Emergency Department Provider Note ____________________________________________  Time seen: 1306  I have reviewed the triage vital signs and the nursing notes.  HISTORY  Chief Complaint  Cough  HPI Patricia Luna is a 57 y.o. female presents to the ED for evaluation of a one-week complaint of intermittently productive cough.  Patient denies any interim fevers but is just does report some discomfort in the chest with cough.  She denies any sick contacts, recent travel, or other exposures.  She also denies any chest pain, vomiting, diarrhea.  Patient has been using her albuterol inhaler for symptom relief.  She has not been able to smoke at her usual rate due to her persistent cough.  Past Medical History:  Diagnosis Date  . COPD (chronic obstructive pulmonary disease) (HCC)   . HIV (human immunodeficiency virus infection) (HCC)     There are no active problems to display for this patient.   Past Surgical History:  Procedure Laterality Date  . CESAREAN SECTION      Prior to Admission medications   Medication Sig Start Date End Date Taking? Authorizing Provider  albuterol (PROVENTIL HFA;VENTOLIN HFA) 108 (90 Base) MCG/ACT inhaler Inhale 2 puffs into the lungs every 6 (six) hours as needed for wheezing or shortness of breath. 08/03/16  Yes Willy Eddyobinson, Patrick, MD  citalopram (CELEXA) 10 MG tablet Take 1 tablet by mouth daily. 10/20/17  Yes [provider]  TRIUMEQ 600-50-300 MG tablet Take 1 tablet by mouth daily. 10/20/17  Yes [provider]  albuterol (PROVENTIL HFA;VENTOLIN HFA) 108 (90 Base) MCG/ACT inhaler Inhale 2 puffs into the lungs every 6 (six) hours as needed for wheezing or shortness of breath. 11/17/17   Willy Eddyobinson, Patrick, MD  azithromycin (ZITHROMAX Z-PAK) 250 MG tablet Take 2 tablets (500 mg) on  Day 1,  followed by 1 tablet (250 mg) once daily on Days 2 through 5. 03/08/18   Nannie Starzyk, Charlesetta IvoryJenise V Bacon, PA-C  benzonatate  (TESSALON PERLES) 100 MG capsule Take 1-2 tabs TID prn cough 03/08/18   Mackinley Cassaday, Charlesetta IvoryJenise V Bacon, PA-C    Allergies Other  No family history on file.  Social History Social History   Tobacco Use  . Smoking status: Former Smoker    Last attempt to quit: 08/18/2016    Years since quitting: 1.5  . Smokeless tobacco: Never Used  Substance Use Topics  . Alcohol use: No  . Drug use: No    Review of Systems  Constitutional: Negative for fever. Eyes: Negative for visual changes. ENT: Negative for sore throat. Cardiovascular: Negative for chest pain.  Respiratory: Negative for shortness of breath.  Reports cough as above. Gastrointestinal: Negative for abdominal pain, vomiting and diarrhea. Genitourinary: Negative for dysuria. Musculoskeletal: Negative for back pain. Skin: Negative for rash. Neurological: Negative for headaches, focal weakness or numbness. ____________________________________________  PHYSICAL EXAM:  VITAL SIGNS: ED Triage Vitals  Enc Vitals Group     BP 03/08/18 1245 (!) 108/56     Pulse Rate 03/08/18 1245 100     Resp 03/08/18 1245 18     Temp 03/08/18 1245 98 F (36.7 C)     Temp Source 03/08/18 1245 Oral     SpO2 03/08/18 1245 98 %     Weight 03/08/18 1246 95 lb (43.1 kg)     Height 03/08/18 1246 5\' 3"  (1.6 m)     Head Circumference --      Peak Flow --      Pain Score 03/08/18 1246 9  Pain Loc --      Pain Edu? --      Excl. in GC? --     Constitutional: Alert and oriented. Well appearing and in no distress. Head: Normocephalic and atraumatic. Eyes: Conjunctivae are normal. Normal extraocular movements Neck: Supple. No thyromegaly. Hematological/Lymphatic/Immunological: No cervical lymphadenopathy. Cardiovascular: Normal rate, regular rhythm. Normal distal pulses. Respiratory: Normal respiratory effort. No wheezes/rales/rhonchi. Gastrointestinal: Soft and nontender. No distention. Musculoskeletal: Nontender with normal range of motion in  all extremities.  Neurologic:  Normal gait without ataxia. Normal speech and language. No gross focal neurologic deficits are appreciated. Skin:  Skin is warm, dry and intact. No rash noted. ____________________________________________   RADIOLOGY  CXR  IMPRESSION: Extensive right middle lobe infiltrate. Small area of left lower lobe infiltrate. Findings consistent with pneumonia. ____________________________________________  PROCEDURES  Procedures Rocephin 250 mg IM ____________________________________________  INITIAL IMPRESSION / ASSESSMENT AND PLAN / ED COURSE  Patient with ED evaluation of a one-week complaint of intermittent cough.  She is also experiencing some chest wall pain related to the cough.  Patient's exam is overall reassuring and her vital signs are stable.  Her chest x-ray does confirm a right middle lobe infiltrate and an early left lower lobe infiltrate.  She will be treated for community-acquired pneumonia with a an issue dose of ceftriaxone in the ED.  She will be discharged with a prescription for azithromycin to dose as directed.  She will continue to utilize her albuterol inhaler and monitor and treat any fevers as necessary.  She will return to the ED as needed for worsening symptoms as discussed. ____________________________________________  FINAL CLINICAL IMPRESSION(S) / ED DIAGNOSES  Final diagnoses:  Community acquired pneumonia of right middle lobe of lung (HCC)  Community acquired pneumonia of left lower lobe of lung (HCC)      Karmen Stabs, Charlesetta Ivory, PA-C 03/08/18 1839    Dionne Bucy, MD 03/08/18 2047

## 2018-03-11 ENCOUNTER — Ambulatory Visit: Payer: Self-pay | Admitting: General Surgery

## 2018-03-12 ENCOUNTER — Encounter: Payer: Self-pay | Admitting: General Surgery

## 2018-04-13 ENCOUNTER — Encounter: Payer: Self-pay | Admitting: General Surgery

## 2018-04-13 ENCOUNTER — Ambulatory Visit (INDEPENDENT_AMBULATORY_CARE_PROVIDER_SITE_OTHER): Payer: Medicare HMO | Admitting: General Surgery

## 2018-04-13 VITALS — BP 120/68 | HR 77 | Resp 12 | Ht 63.0 in | Wt 91.0 lb

## 2018-04-13 DIAGNOSIS — K6289 Other specified diseases of anus and rectum: Secondary | ICD-10-CM | POA: Diagnosis not present
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
## 2018-04-13 NOTE — Patient Instructions (Addendum)
Patient to return as needed. No soap just water. Use   Glycerin Suppository.  he patient is aware to call back for any questions or concerns.

## 2018-06-10 ENCOUNTER — Inpatient Hospital Stay
Admission: EM | Admit: 2018-06-10 | Discharge: 2018-06-11 | DRG: 871 | Disposition: A | Discharge status: Home / Self Care | Payer: Medicare HMO | Attending: Internal Medicine | Admitting: Internal Medicine

## 2018-06-10 ENCOUNTER — Encounter: Payer: Self-pay | Admitting: Emergency Medicine

## 2018-06-10 ENCOUNTER — Other Ambulatory Visit: Payer: Self-pay

## 2018-06-10 ENCOUNTER — Emergency Department: Payer: Medicare HMO

## 2018-06-10 DIAGNOSIS — J441 Chronic obstructive pulmonary disease with (acute) exacerbation: Secondary | ICD-10-CM

## 2018-06-10 DIAGNOSIS — Z79899 Other long term (current) drug therapy: Secondary | ICD-10-CM | POA: Diagnosis not present

## 2018-06-10 DIAGNOSIS — B20 Human immunodeficiency virus [HIV] disease: Secondary | ICD-10-CM

## 2018-06-10 DIAGNOSIS — F17200 Nicotine dependence, unspecified, uncomplicated: Secondary | ICD-10-CM | POA: Diagnosis present

## 2018-06-10 DIAGNOSIS — Z21 Asymptomatic human immunodeficiency virus [HIV] infection status: Secondary | ICD-10-CM

## 2018-06-10 DIAGNOSIS — Z23 Encounter for immunization: Secondary | ICD-10-CM

## 2018-06-10 DIAGNOSIS — J181 Lobar pneumonia, unspecified organism: Secondary | ICD-10-CM | POA: Diagnosis present

## 2018-06-10 DIAGNOSIS — A419 Sepsis, unspecified organism: Secondary | ICD-10-CM | POA: Diagnosis not present

## 2018-06-10 DIAGNOSIS — E43 Unspecified severe protein-calorie malnutrition: Secondary | ICD-10-CM | POA: Diagnosis present

## 2018-06-10 DIAGNOSIS — J440 Chronic obstructive pulmonary disease with acute lower respiratory infection: Secondary | ICD-10-CM | POA: Diagnosis present

## 2018-06-10 DIAGNOSIS — H9190 Unspecified hearing loss, unspecified ear: Secondary | ICD-10-CM | POA: Diagnosis present

## 2018-06-10 DIAGNOSIS — Z681 Body mass index (BMI) 19 or less, adult: Secondary | ICD-10-CM | POA: Diagnosis not present

## 2018-06-10 DIAGNOSIS — F419 Anxiety disorder, unspecified: Secondary | ICD-10-CM | POA: Diagnosis present

## 2018-06-10 DIAGNOSIS — E876 Hypokalemia: Secondary | ICD-10-CM | POA: Diagnosis present

## 2018-06-10 DIAGNOSIS — F329 Major depressive disorder, single episode, unspecified: Secondary | ICD-10-CM | POA: Diagnosis present

## 2018-06-10 DIAGNOSIS — J189 Pneumonia, unspecified organism: Secondary | ICD-10-CM

## 2018-06-10 DIAGNOSIS — Z7951 Long term (current) use of inhaled steroids: Secondary | ICD-10-CM | POA: Diagnosis not present

## 2018-06-10 LAB — DIFFERENTIAL
Basophils Absolute: 0 10*3/uL (ref 0–0.1)
Basophils Relative: 0 %
EOS PCT: 0 %
Eosinophils Absolute: 0 10*3/uL (ref 0–0.7)
LYMPHS PCT: 8 %
Lymphs Abs: 1 10*3/uL (ref 1.0–3.6)
MONO ABS: 0.9 10*3/uL (ref 0.2–0.9)
Monocytes Relative: 7 %
Neutro Abs: 11.8 10*3/uL — ABNORMAL HIGH (ref 1.4–6.5)
Neutrophils Relative %: 85 %

## 2018-06-10 LAB — TROPONIN I

## 2018-06-10 LAB — URINALYSIS, ROUTINE W REFLEX MICROSCOPIC
Bilirubin Urine: NEGATIVE
GLUCOSE, UA: NEGATIVE mg/dL
HGB URINE DIPSTICK: NEGATIVE
KETONES UR: NEGATIVE mg/dL
LEUKOCYTES UA: NEGATIVE
Nitrite: NEGATIVE
PROTEIN: NEGATIVE mg/dL
Specific Gravity, Urine: 1.008 (ref 1.005–1.030)
pH: 7 (ref 5.0–8.0)

## 2018-06-10 LAB — CBC
HCT: 35.1 % (ref 35.0–47.0)
Hemoglobin: 12.1 g/dL (ref 12.0–16.0)
MCH: 34.6 pg — AB (ref 26.0–34.0)
MCHC: 34.6 g/dL (ref 32.0–36.0)
MCV: 100 fL (ref 80.0–100.0)
PLATELETS: 526 10*3/uL — AB (ref 150–440)
RBC: 3.51 MIL/uL — AB (ref 3.80–5.20)
RDW: 15.4 % — ABNORMAL HIGH (ref 11.5–14.5)
WBC: 14.3 10*3/uL — ABNORMAL HIGH (ref 3.6–11.0)

## 2018-06-10 LAB — BASIC METABOLIC PANEL
ANION GAP: 10 (ref 5–15)
BUN: 9 mg/dL (ref 6–20)
CALCIUM: 8.8 mg/dL — AB (ref 8.9–10.3)
CO2: 25 mmol/L (ref 22–32)
CREATININE: 0.41 mg/dL — AB (ref 0.44–1.00)
Chloride: 99 mmol/L (ref 98–111)
Glucose, Bld: 93 mg/dL (ref 70–99)
Potassium: 3.3 mmol/L — ABNORMAL LOW (ref 3.5–5.1)
Sodium: 134 mmol/L — ABNORMAL LOW (ref 135–145)

## 2018-06-10 LAB — LACTIC ACID, PLASMA
LACTIC ACID, VENOUS: 1.4 mmol/L (ref 0.5–1.9)
Lactic Acid, Venous: 2.5 mmol/L (ref 0.5–1.9)

## 2018-06-10 LAB — LACTATE DEHYDROGENASE: LDH: 131 U/L (ref 98–192)

## 2018-06-10 MED ORDER — ENOXAPARIN SODIUM 40 MG/0.4ML ~~LOC~~ SOLN
40.0000 mg | SUBCUTANEOUS | Status: DC
  Administered 2018-06-10: 40 mg via SUBCUTANEOUS
  Filled 2018-06-10: qty 0.4

## 2018-06-10 MED ORDER — ONDANSETRON HCL 4 MG PO TABS: 4.0000 mg | ORAL_TABLET | Freq: Four times a day (QID) | ORAL | Status: DC | PRN

## 2018-06-10 MED ORDER — AZITHROMYCIN 500 MG PO TABS
500.0000 mg | ORAL_TABLET | Freq: Every day | ORAL | Status: AC
  Administered 2018-06-10: 500 mg via ORAL
  Filled 2018-06-10: qty 1

## 2018-06-10 MED ORDER — SODIUM CHLORIDE 0.9 % IV SOLN
1.0000 g | INTRAVENOUS | Status: DC
  Filled 2018-06-10: qty 10

## 2018-06-10 MED ORDER — AZITHROMYCIN 500 MG PO TABS
250.0000 mg | ORAL_TABLET | Freq: Every day | ORAL | Status: DC
  Administered 2018-06-11: 250 mg via ORAL
  Filled 2018-06-10: qty 0.5
  Filled 2018-06-10: qty 1

## 2018-06-10 MED ORDER — IPRATROPIUM-ALBUTEROL 0.5-2.5 (3) MG/3ML IN SOLN
3.0000 mL | Freq: Once | RESPIRATORY_TRACT | Status: AC
  Administered 2018-06-10: 3 mL via RESPIRATORY_TRACT
  Filled 2018-06-10: qty 3

## 2018-06-10 MED ORDER — DOCUSATE SODIUM 100 MG PO CAPS
100.0000 mg | ORAL_CAPSULE | Freq: Two times a day (BID) | ORAL | Status: DC
  Administered 2018-06-10 – 2018-06-11 (×2): 100 mg via ORAL
  Filled 2018-06-10 (×2): qty 1

## 2018-06-10 MED ORDER — ONDANSETRON HCL 4 MG/2ML IJ SOLN: 4.0000 mg | Freq: Four times a day (QID) | INTRAMUSCULAR | Status: DC | PRN

## 2018-06-10 MED ORDER — SODIUM CHLORIDE 0.9 % IV SOLN: 1.0000 g | INTRAVENOUS | Status: DC

## 2018-06-10 MED ORDER — EMTRICITABINE-TENOFOVIR AF 200-25 MG PO TABS
1.0000 | ORAL_TABLET | Freq: Every day | ORAL | Status: DC
  Administered 2018-06-11: 1 via ORAL
  Filled 2018-06-10: qty 1

## 2018-06-10 MED ORDER — SODIUM CHLORIDE 0.9 % IV BOLUS
1000.0000 mL | Freq: Once | INTRAVENOUS | Status: AC
  Administered 2018-06-10: 1000 mL via INTRAVENOUS

## 2018-06-10 MED ORDER — METHYLPREDNISOLONE SODIUM SUCC 125 MG IJ SOLR
60.0000 mg | INTRAMUSCULAR | Status: DC
  Administered 2018-06-10: 60 mg via INTRAVENOUS
  Filled 2018-06-10: qty 2

## 2018-06-10 MED ORDER — PNEUMOCOCCAL VAC POLYVALENT 25 MCG/0.5ML IJ INJ
0.5000 mL | INJECTION | INTRAMUSCULAR | Status: AC
  Administered 2018-06-11: 0.5 mL via INTRAMUSCULAR
  Filled 2018-06-10: qty 0.5

## 2018-06-10 MED ORDER — TRAZODONE HCL 50 MG PO TABS: 25.0000 mg | ORAL_TABLET | Freq: Every evening | ORAL | Status: DC | PRN

## 2018-06-10 MED ORDER — SODIUM CHLORIDE 0.9 % IV SOLN
1.0000 g | Freq: Once | INTRAVENOUS | Status: AC
  Administered 2018-06-10: 1 g via INTRAVENOUS
  Filled 2018-06-10: qty 10

## 2018-06-10 MED ORDER — SULFAMETHOXAZOLE-TRIMETHOPRIM 800-160 MG PO TABS
1.0000 | ORAL_TABLET | Freq: Once | ORAL | Status: AC
  Administered 2018-06-10: 1 via ORAL
  Filled 2018-06-10: qty 1

## 2018-06-10 MED ORDER — ACETAMINOPHEN 650 MG RE SUPP: 650.0000 mg | Freq: Four times a day (QID) | RECTAL | Status: DC | PRN

## 2018-06-10 MED ORDER — HYDROCODONE-ACETAMINOPHEN 5-325 MG PO TABS
1.0000 | ORAL_TABLET | ORAL | Status: DC | PRN
  Administered 2018-06-11: 1 via ORAL
  Filled 2018-06-10: qty 1

## 2018-06-10 MED ORDER — DOLUTEGRAVIR SODIUM 50 MG PO TABS
50.0000 mg | ORAL_TABLET | Freq: Every day | ORAL | Status: DC
  Administered 2018-06-11: 50 mg via ORAL
  Filled 2018-06-10: qty 1

## 2018-06-10 MED ORDER — BISACODYL 5 MG PO TBEC: 5.0000 mg | DELAYED_RELEASE_TABLET | Freq: Every day | ORAL | Status: DC | PRN

## 2018-06-10 MED ORDER — ACETAMINOPHEN 325 MG PO TABS: 650.0000 mg | ORAL_TABLET | Freq: Four times a day (QID) | ORAL | Status: DC | PRN

## 2018-06-10 MED ORDER — SODIUM CHLORIDE 0.9 % IV SOLN
INTRAVENOUS | Status: DC
  Administered 2018-06-10 – 2018-06-11 (×2): via INTRAVENOUS

## 2018-06-10 MED ORDER — DOXYCYCLINE HYCLATE 100 MG PO TABS
100.0000 mg | ORAL_TABLET | Freq: Once | ORAL | Status: AC
  Administered 2018-06-10: 100 mg via ORAL
  Filled 2018-06-10: qty 1

## 2018-06-10 MED ORDER — METHYLPREDNISOLONE SODIUM SUCC 125 MG IJ SOLR
125.0000 mg | Freq: Once | INTRAMUSCULAR | Status: AC
  Administered 2018-06-10: 125 mg via INTRAVENOUS
  Filled 2018-06-10: qty 2

## 2018-06-10 MED ORDER — ABACAVIR-DOLUTEGRAVIR-LAMIVUD 600-50-300 MG PO TABS
1.0000 | ORAL_TABLET | Freq: Every day | ORAL | Status: DC
  Filled 2018-06-10: qty 1

## 2018-06-10 MED ORDER — IPRATROPIUM-ALBUTEROL 0.5-2.5 (3) MG/3ML IN SOLN
3.0000 mL | Freq: Four times a day (QID) | RESPIRATORY_TRACT | Status: DC
  Administered 2018-06-10 – 2018-06-11 (×5): 3 mL via RESPIRATORY_TRACT
  Filled 2018-06-10 (×5): qty 3
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
## 2018-06-10 NOTE — Progress Notes (Signed)
CODE SEPSIS - PHARMACY COMMUNICATION  **Broad Spectrum Antibiotics should be administered within 1 hour of Sepsis diagnosis**  Time Code Sepsis Called/Page Received: 1214  Antibiotics Ordered: Doxycline, Septra, Ceftriaxone   Time of 1st antibiotic administration: Doxycycline/Septra - 1223   Additional action taken by pharmacy: None indicated   If necessary, Name of Provider/Nurse Contacted: none indicated     Simpson,Michael L ,PharmD Clinical Pharmacist  06/10/2018  12:28 PM

## 2018-06-10 NOTE — ED Notes (Signed)
Informed RN that patient has been roomed and is ready for evaluation.  Patient in NAD at this time and call bell placed within reach.   

## 2018-06-10 NOTE — ED Provider Notes (Signed)
[facility] Emergency Department Provider Note  ____________________________________________  Time seen: Approximately 12:09 PM  I have reviewed the triage vital signs and the nursing notes.   HISTORY  Chief Complaint Shortness of Breath and Chest Pain   HPI Patricia Luna is a 57 y.o. female with a history of HIV, COPD, and anxiety who presents for evaluation of cough and shortness of breath.  She reports that her symptoms started yesterday.  She does not know her CD4 count or viral load.  She is followed by the health department with her last appointment being 6 months ago.  She endorses compliance with her antiretroviral medication.  She reports that her cough is dry, moderate, associated with shortness of breath which is mild at rest but moderate in intensity with exertion.  She has had chills and subjective fever at home.  No vomiting but has had nausea.  No diarrhea or abdominal pain.  Past Medical History:  Diagnosis Date  . Anxiety   . COPD (chronic obstructive pulmonary disease) (HCC)   . Depression   . Hearing loss   . HIV (human immunodeficiency virus infection) [facility](HCC)     Patient Active Problem List   Diagnosis Date Noted  . Rectal pain 04/13/2018    Past Surgical History:  Procedure Laterality Date  . CESAREAN SECTION    . COLONOSCOPY  2018    Prior to Admission medications   Medication Sig Start Date End Date Taking? Authorizing Provider  citalopram (CELEXA) 10 MG tablet Take 1 tablet by mouth daily. 10/20/17  Yes [provider]  DESCOVY 200-25 MG tablet Take 1 tablet by mouth daily.  04/01/18  Yes [provider]  TIVICAY 50 MG tablet Take 50 mg by mouth daily.  04/01/18  Yes [provider]  TRIUMEQ 600-50-300 MG tablet Take 1 tablet by mouth daily. 10/20/17  Yes [provider]  albuterol (PROVENTIL HFA;VENTOLIN HFA) 108 (90 Base) MCG/ACT inhaler Inhale 2 puffs into the lungs every 6 (six) hours as  needed for wheezing or shortness of breath. 08/03/16   Willy Eddyobinson, Patrick, MD  albuterol (PROVENTIL HFA;VENTOLIN HFA) 108 (90 Base) MCG/ACT inhaler Inhale 2 puffs into the lungs every 6 (six) hours as needed for wheezing or shortness of breath. Patient not taking: Reported on 06/10/2018 11/17/17   Willy Eddyobinson, Patrick, MD  azithromycin (ZITHROMAX Z-PAK) 250 MG tablet Take 2 tablets (500 mg) on  Day 1,  followed by 1 tablet (250 mg) once daily on Days 2 through 5. Patient not taking: Reported on 06/10/2018 03/08/18   Menshew, Charlesetta IvoryJenise V Bacon, PA-C  benzonatate (TESSALON PERLES) 100 MG capsule Take 1-2 tabs TID prn cough Patient not taking: Reported on 06/10/2018 03/08/18   Menshew, Charlesetta IvoryJenise V Bacon, PA-C    Allergies Other  No family history on file.  Social History Social History   Tobacco Use  . Smoking status: Current Every Day Smoker    Last attempt to quit: 08/18/2016    Years since quitting: 1.8  . Smokeless tobacco: Never Used  Substance Use Topics  . Alcohol use: Yes  . Drug use: No    Review of Systems  Constitutional: + subjective fever and chills Eyes: Negative for visual changes. ENT: Negative for sore throat. Neck: No neck pain  Cardiovascular: Negative for chest pain. Respiratory: + shortness of breath, cough Gastrointestinal: Negative for abdominal pain, vomiting or diarrhea. Genitourinary: Negative for dysuria. Musculoskeletal: Negative for back pain. Skin: Negative for rash. Neurological: Negative for headaches, weakness or  numbness. Psych: No SI or HI  ____________________________________________   PHYSICAL EXAM:  VITAL SIGNS: ED Triage Vitals [06/10/18 1114]  Enc Vitals Group     BP 113/64     Pulse Rate (!) 104     Resp 20     Temp 98 F (36.7 C)     Temp Source Oral     SpO2 98 %     Weight 95 lb (43.1 kg)     Height 5\' 3"  (1.6 m)     Head Circumference      Peak Flow      Pain Score 9     Pain Loc      Pain Edu?      Excl. in GC?      Constitutional: Alert and oriented. Well appearing and in no apparent distress. HEENT:      Head: Normocephalic and atraumatic.         Eyes: Conjunctivae are normal. Sclera is non-icteric.       Mouth/Throat: Mucous membranes are moist.       Neck: Supple with no signs of meningismus. Cardiovascular: Tachycardic with regular. No murmurs, gallops, or rubs. 2+ symmetrical distal pulses are present in all extremities. No JVD. Respiratory: Normal respiratory effort, normal sats.  Decreased air movement bilaterally with expiratory wheezes  Gastrointestinal: Soft, non tender, and non distended with positive bowel sounds. No rebound or guarding. Musculoskeletal: Nontender with normal range of motion in all extremities. No edema, cyanosis, or erythema of extremities. Neurologic: Normal speech and language. Face is symmetric. Moving all extremities. No gross focal neurologic deficits are appreciated. Skin: Skin is warm, dry and intact. No rash noted. Psychiatric: Mood and affect are normal. Speech and behavior are normal.  ____________________________________________   LABS (all labs ordered are listed, but only abnormal results are displayed)  Labs Reviewed  BASIC METABOLIC PANEL - Abnormal; Notable for the following components:      Result Value   Sodium 134 (*)    Potassium 3.3 (*)    Creatinine, Ser 0.41 (*)    Calcium 8.8 (*)    All other components within normal limits  CBC - Abnormal; Notable for the following components:   WBC 14.3 (*)    RBC 3.51 (*)    MCH 34.6 (*)    RDW 15.4 (*)    Platelets 526 (*)    All other components within normal limits  CULTURE, BLOOD (ROUTINE X 2)  CULTURE, BLOOD (ROUTINE X 2)  TROPONIN I  URINALYSIS, ROUTINE W REFLEX MICROSCOPIC  LACTIC ACID, PLASMA  LACTIC ACID, PLASMA  LACTATE DEHYDROGENASE  DIFFERENTIAL   ____________________________________________  EKG  ED ECG REPORT I, Nita Sickle, the attending physician, personally  viewed and interpreted this ECG.  Sinus tachycardia, rate of 104, normal intervals, LVH, no ST elevations or depressions. Unchanged from prior ____________________________________________  RADIOLOGY  I have personally reviewed the images performed during this visit and I agree with the Radiologist's read.   Interpretation by Radiologist:  Dg Chest 2 View  Result Date: 06/10/2018 CLINICAL DATA:  Left chest pain inferiorly and in the back with a pleuritic component. Also shortness of breath. History of COPD, HIV, current smoker. EXAM: CHEST - 2 VIEW COMPARISON:  PA and lateral chest x-ray of March 08, 2018 FINDINGS: The lungs are hyperinflated. There is an infiltrate in the left lower lobe which is new. The interstitial markings elsewhere are coarse though stable. The heart and pulmonary vascularity are normal. The mediastinum is normal  in width. The trachea is midline. There is calcification in the wall of the aortic arch. The bony thorax exhibits no acute abnormality. IMPRESSION: COPD. Acute left lower lobe pneumonia. Followup PA and lateral chest X-ray is recommended in 3-4 weeks following trial of antibiotic therapy to ensure resolution and exclude underlying malignancy. Thoracic aortic atherosclerosis. Electronically Signed   By: David  [place] M.D.   On: 06/10/2018 11:45     ____________________________________________   PROCEDURES  Procedure(s) performed: None Procedures Critical Care performed: yes  CRITICAL CARE Performed by: Nita Sickle  ?  Total critical care time: 35 min  Critical care time was exclusive of separately billable procedures and treating other patients.  Critical care was necessary to treat or prevent imminent or life-threatening deterioration.  Critical care was time spent personally by me on the following activities: development of treatment plan with patient and/or surrogate as well as nursing, discussions with consultants, evaluation of patient's  response to treatment, examination of patient, obtaining history from patient or surrogate, ordering and performing treatments and interventions, ordering and review of laboratory studies, ordering and review of radiographic studies, pulse oximetry and re-evaluation of patient's condition.  ____________________________________________   INITIAL IMPRESSION / ASSESSMENT AND PLAN / ED COURSE   57 y.o. female with a history of HIV, COPD, and anxiety who presents for evaluation of cough and shortness of breath.  Unknown CD4 count and viral load.  Chest x-ray concerning for pneumonia, patient is slightly tachypneic, tachycardic and with a white count of 14.4 meeting sepsis criteria.  Last admission was in June.  Will cover for community-acquired pneumonia plus Bactrim for possible PCP and will give Rocephin and doxycycline.  Will give Solu-Medrol and DuoNeb's.  Will admit to the hospitalist service.      As part of my medical decision making, I reviewed the following data within the electronic MEDICAL RECORD NUMBER Nursing notes reviewed and incorporated, Labs reviewed , EKG interpreted , Old EKG reviewed, Old chart reviewed, Radiograph reviewed , Discussed with admitting physician , Notes from prior ED visits and [place] Controlled Substance Database    Pertinent labs & imaging results that were available during my care of the patient were reviewed by me and considered in my medical decision making (see chart for details).    ____________________________________________   FINAL CLINICAL IMPRESSION(S) / ED DIAGNOSES  Final diagnoses:  Sepsis, due to unspecified organism [facility])  Community acquired pneumonia, unspecified laterality  HIV infection, unspecified symptom status (HCC)  COPD exacerbation (HCC)      NEW MEDICATIONS STARTED DURING THIS VISIT:  ED Discharge Orders    None       Note:  This document was prepared using Dragon voice recognition software and may include unintentional  dictation errors.    Don Perking, [place], MD 06/10/18 [phone]

## 2018-06-10 NOTE — Progress Notes (Signed)
CRITICAL VALUE ALERT  Critical Value:  Lactic acid 2.5  Date & Time Notied:  06/10/18 1545  Provider Notified: Dr. Luberta MutterKonidena  Orders Received/Actions taken: acknowledged, no new orders

## 2018-06-10 NOTE — ED Triage Notes (Signed)
Pt to ED via POV. Pt states that since last night she has been having pain in her left chest, under her breast and around into her back. Pt states that pain is worse when she coughs. Pt is also c/o shortness of breath. Pt states that she has hx/o COPD.

## 2018-06-11 LAB — CBC
HEMATOCRIT: 29.1 % — AB (ref 35.0–47.0)
Hemoglobin: 10.1 g/dL — ABNORMAL LOW (ref 12.0–16.0)
MCH: 34.6 pg — AB (ref 26.0–34.0)
MCHC: 34.8 g/dL (ref 32.0–36.0)
MCV: 99.5 fL (ref 80.0–100.0)
PLATELETS: 440 10*3/uL (ref 150–440)
RBC: 2.92 MIL/uL — ABNORMAL LOW (ref 3.80–5.20)
RDW: 15.4 % — AB (ref 11.5–14.5)
WBC: 9.7 10*3/uL (ref 3.6–11.0)

## 2018-06-11 LAB — BASIC METABOLIC PANEL
Anion gap: 8 (ref 5–15)
BUN: 10 mg/dL (ref 6–20)
CHLORIDE: 104 mmol/L (ref 98–111)
CO2: 24 mmol/L (ref 22–32)
CREATININE: 0.58 mg/dL (ref 0.44–1.00)
Calcium: 8.1 mg/dL — ABNORMAL LOW (ref 8.9–10.3)
GFR calc Af Amer: >60 mL/min (ref >60)
GFR calc non Af Amer: >60 mL/min (ref >60)
GLUCOSE: 184 mg/dL — AB (ref 70–99)
Potassium: 3.6 mmol/L (ref 3.5–5.1)
Sodium: 136 mmol/L (ref 135–145)

## 2018-06-11 LAB — GLUCOSE, CAPILLARY: Glucose-Capillary: 157 mg/dL — ABNORMAL HIGH (ref 70–99)

## 2018-06-11 MED ORDER — AMOXICILLIN-POT CLAVULANATE 875-125 MG PO TABS: 1.0000 | ORAL_TABLET | Freq: Two times a day (BID) | ORAL | 0 refills | Status: DC

## 2018-06-11 MED ORDER — FLUTICASONE PROPIONATE HFA 220 MCG/ACT IN AERO: 2.0000 | INHALATION_SPRAY | Freq: Two times a day (BID) | RESPIRATORY_TRACT | 12 refills | Status: AC

## 2018-06-11 MED ORDER — ADULT MULTIVITAMIN W/MINERALS CH
1.0000 | ORAL_TABLET | Freq: Every day | ORAL | Status: DC
  Administered 2018-06-11: 1 via ORAL
  Filled 2018-06-11: qty 1

## 2018-06-11 MED ORDER — ADULT MULTIVITAMIN W/MINERALS CH: 1.0000 | ORAL_TABLET | Freq: Every day | ORAL | 0 refills | Status: AC

## 2018-06-11 MED ORDER — PREDNISONE 10 MG PO TABS: ORAL_TABLET | ORAL | 0 refills | Status: DC

## 2018-06-11 MED ORDER — AZITHROMYCIN 250 MG PO TABS: ORAL_TABLET | ORAL | 0 refills | Status: DC

## 2018-06-11 MED ORDER — SODIUM CHLORIDE 0.9 % IV SOLN
1.0000 g | Freq: Once | INTRAVENOUS | Status: AC
  Administered 2018-06-11: 1 g via INTRAVENOUS
  Filled 2018-06-11: qty 1

## 2018-06-11 MED ORDER — AMOXICILLIN-POT CLAVULANATE 875-125 MG PO TABS: 1.0000 | ORAL_TABLET | Freq: Two times a day (BID) | ORAL | Status: DC

## 2018-06-11 MED ORDER — ENSURE ENLIVE PO LIQD
237.0000 mL | Freq: Two times a day (BID) | ORAL | Status: DC
  Administered 2018-06-11: 237 mL via ORAL

## 2018-06-11 MED ORDER — ENSURE ENLIVE PO LIQD: 237.0000 mL | Freq: Two times a day (BID) | ORAL | 0 refills | Status: AC

## 2018-06-11 NOTE — Progress Notes (Signed)
Patient given discharge instructions, verbalized information back. ABC intact, all questions answered. No further needs. Will notify desk when ride is here so volunteer services can take her out by w/c. Patient hard of hearing. No further needs.

## 2018-06-11 NOTE — Discharge Summary (Signed)
[employer] -  at [facility]   PATIENT NAME: Patricia CooperBernadine Luna    MR#:  [id number]  DATE OF BIRTH:  [birth year]  DATE OF ADMISSION:  06/10/2018 ADMITTING PHYSICIAN: Patricia HammingSnehalatha Konidena, Luna  DATE OF DISCHARGE: 06/11/2018  PRIMARY CARE PHYSICIAN: Patricia Luna    ADMISSION DIAGNOSIS:  COPD exacerbation (HCC) [J44.1] Sepsis, due to unspecified organism (HCC) [A41.9] Community acquired pneumonia, unspecified laterality [J18.9] HIV infection, unspecified symptom status (HCC) [B20]  DISCHARGE DIAGNOSIS:  Active Problems:   Sepsis (HCC)   SECONDARY DIAGNOSIS:   Past Medical History:  Diagnosis Date  . Anxiety   . COPD (chronic obstructive pulmonary disease) (HCC)   . Depression   . Hearing loss   . HIV (human immunodeficiency virus infection) (HCC)     HOSPITAL COURSE:   1.   Clinical sepsis with left lower lobe pneumonia tachycardia and leukocytosis on presentation.  Patient was feeling much better and wanted to go home.  Patient was afebrile.  White blood cell count has come down to the normal range.  Patient not tachycardic.  Patient received 2 doses of Rocephin and Zithromax here in the hospital.  Switch over to Augmentin for 5 days and finish up Zithromax 3 more days.  Blood cultures negative so far. 2.  HIV on antiretrovirals.  Case discussed with Dr. Joylene Luna for follow-up. 3.  Hypokalemia replaced 4.  Tobacco abuse.  Smoking cessation counseling done 4 minutes by me.  Patient was given steroids in the emergency room possibly for COPD exacerbation but not hearing any wheeze today we will give a quick prednisone taper to off. 5.  Impaired fasting glucose.  Try to add on a hemoglobin A1c  DISCHARGE CONDITIONS:   Satisfactory  CONSULTS OBTAINED:  None  DRUG ALLERGIES:   Allergies  Allergen Reactions  . Mushroom Extract Complex     Reaction type: Headache  . Other     (patient thinks Vancomycin) A shot they gave me and I wound up  in ICU     DISCHARGE MEDICATIONS:   Allergies as of 06/11/2018      Reactions   Mushroom Extract Complex    Reaction type: Headache   Other    (patient thinks Vancomycin) A shot they gave me and I wound up in ICU       Medication List    STOP taking these medications   benzonatate 100 MG capsule Commonly known as:  TESSALON   TRIUMEQ 600-50-300 MG tablet Generic drug:  abacavir-dolutegravir-lamiVUDine     TAKE these medications   albuterol 108 (90 Base) MCG/ACT inhaler Commonly known as:  PROVENTIL HFA;VENTOLIN HFA Inhale 2 puffs into the lungs every 6 (six) hours as needed for wheezing or shortness of breath. What changed:  Another medication with the same name was removed. Continue taking this medication, and follow the directions you see here.   amoxicillin-clavulanate 875-125 MG tablet Commonly known as:  AUGMENTIN Take 1 tablet by mouth every 12 (twelve) hours. Start taking on:  06/12/2018   azithromycin 250 MG tablet Commonly known as:  ZITHROMAX One tab po daily for three days What changed:  additional instructions   citalopram 10 MG tablet Commonly known as:  CELEXA Take 1 tablet by mouth daily.   DESCOVY 200-25 MG tablet Generic drug:  emtricitabine-tenofovir AF Take 1 tablet by mouth daily.   feeding supplement (ENSURE ENLIVE) Liqd Take 237 mLs by mouth 2 (two) times daily between meals.   fluticasone 220 MCG/ACT inhaler Commonly known  as:  FLOVENT HFA Inhale 2 puffs into the lungs 2 (two) times daily. Rinse out mouth   multivitamin with minerals Tabs tablet Take 1 tablet by mouth daily. Start taking on:  06/12/2018   predniSONE 10 MG tablet Commonly known as:  DELTASONE 3 tabs po day 1, 2 tabs po day2; 1 tab po day 3 Start taking on:  06/12/2018   TIVICAY 50 MG tablet Generic drug:  dolutegravir Take 50 mg by mouth daily.        DISCHARGE INSTRUCTIONS:   Follow-up PMD 6 days Follow-up infectious disease 4 weeks  If you experience  worsening of your admission symptoms, develop shortness of breath, life threatening emergency, suicidal or homicidal thoughts you must seek medical attention immediately by calling 911 or calling your Luna immediately  if symptoms less severe.  You Must read complete instructions/literature along with all the possible adverse reactions/side effects for all the Medicines you take and that have been prescribed to you. Take any new Medicines after you have completely understood and accept all the possible adverse reactions/side effects.   Please note  You were cared for by a hospitalist during your hospital stay. If you have any questions about your discharge medications or the care you received while you were in the hospital after you are discharged, you can call the unit and asked to speak with the hospitalist on call if the hospitalist that took care of you is not available. Once you are discharged, your primary care physician will handle any further medical issues. Please note that NO REFILLS for any discharge medications will be authorized once you are discharged, as it is imperative that you return to your primary care physician (or establish a relationship with a primary care physician if you do not have one) for your aftercare needs so that they can reassess your need for medications and monitor your lab values.    Today   CHIEF COMPLAINT:   Chief Complaint  Patient presents with  . Shortness of Breath  . Chest Pain    HISTORY OF PRESENT ILLNESS:  Patricia Luna  is a 57 y.o. female presented with shortness of breath and chest pain   VITAL SIGNS:  Blood pressure (!) 145/89, pulse 84, temperature 98.4 F (36.9 C), temperature source Oral, resp. rate 20, height 5\' 3"  (1.6 m), weight 44.9 kg, SpO2 100 %.   PHYSICAL EXAMINATION:  GENERAL:  57 y.o.-year-old patient lying in the bed with no acute distress.  EYES: Pupils equal, round, reactive to light and accommodation. No scleral  icterus. Extraocular muscles intact.  HEENT: Head atraumatic, normocephalic. Oropharynx and nasopharynx clear.  NECK:  Supple, no jugular venous distention. No thyroid enlargement, no tenderness.  LUNGS: Decreased breath sounds bilateral, no wheezing, rales,rhonchi or crepitation. No use of accessory muscles of respiration.  CARDIOVASCULAR: S1, S2 normal. No murmurs, rubs, or gallops.  ABDOMEN: Soft, non-tender, non-distended. Bowel sounds present. No organomegaly or mass.  EXTREMITIES: No pedal edema, cyanosis, or clubbing.  NEUROLOGIC: Cranial nerves II through XII are intact. Muscle strength 5/5 in all extremities. Sensation intact. Gait not checked.  PSYCHIATRIC: The patient is alert and oriented x 3.  SKIN: No obvious rash, lesion, or ulcer.   DATA REVIEW:   CBC Recent Labs  Lab 06/11/18 0405  WBC 9.7  HGB 10.1*  HCT 29.1*  PLT 440    Chemistries  Recent Labs  Lab 06/11/18 0405  NA 136  K 3.6  CL 104  CO2 24  GLUCOSE  184*  BUN 10  CREATININE 0.58  CALCIUM 8.1*    Cardiac Enzymes Recent Labs  Lab 06/10/18 1117  TROPONINI <0.03    Microbiology Results  Results for orders placed or performed during the hospital encounter of 06/10/18  Blood culture (routine x 2)     Status: None (Preliminary result)   Collection Time: 06/10/18 12:44 PM  Result Value Ref Range Status   Specimen Description BLOOD LEFT FA  Final   Special Requests   Final    BOTTLES DRAWN AEROBIC AND ANAEROBIC Blood Culture adequate volume   Culture   Final    NO GROWTH < 24 HOURS Performed at [facility], [address] [place], [place] [address]    Report Status PENDING  Incomplete  Blood culture (routine x 2)     Status: None (Preliminary result)   Collection Time: 06/10/18 12:44 PM  Result Value Ref Range Status   Specimen Description BLOOD RIGHT FA  Final   Special Requests   Final    BOTTLES DRAWN AEROBIC AND ANAEROBIC Blood Culture adequate volume   Culture   Final     NO GROWTH < 24 HOURS Performed at [facility], [address], [place], [place] [address]    Report Status PENDING  Incomplete    RADIOLOGY:  Dg Chest 2 View  Result Date: 06/10/2018 CLINICAL DATA:  Left chest pain inferiorly and in the back with a pleuritic component. Also shortness of breath. History of COPD, HIV, current smoker. EXAM: CHEST - 2 VIEW COMPARISON:  PA and lateral chest x-ray of March 08, 2018 FINDINGS: The lungs are hyperinflated. There is an infiltrate in the left lower lobe which is new. The interstitial markings elsewhere are coarse though stable. The heart and pulmonary vascularity are normal. The mediastinum is normal in width. The trachea is midline. There is calcification in the wall of the aortic arch. The bony thorax exhibits no acute abnormality. IMPRESSION: COPD. Acute left lower lobe pneumonia. Followup PA and lateral chest X-ray is recommended in 3-4 weeks following trial of antibiotic therapy to ensure resolution and exclude underlying malignancy. Thoracic aortic atherosclerosis. Electronically Signed   By: David  [place] M.D.   On: 06/10/2018 11:45     Management plans discussed with the patient, and she is in agreement.  CODE STATUS:     Code Status Orders  (From admission, onward)         Start     Ordered   06/10/18 1251  Full code  Continuous     06/10/18 1252        Code Status History    This patient has a current code status but no historical code status.      TOTAL TIME TAKING CARE OF THIS PATIENT: 35 minutes.    Alford Highland M.D on 06/11/2018 at 2:47 PM  Between 7am to 6pm - Pager - [phone]  After 6pm go to [email or web address] - password [email or web address]  [employer] Office  [phone]  CC: Primary care physician; Patricia Fleeting, Luna

## 2018-06-11 NOTE — Consult Note (Signed)
Date of Admission:  06/10/2018                 Reason for Consult: HIV   Referring Provider: Dr. Luberta Mutter   HPI: Patricia Luna is a 57 y.o. female with history of HIV, COPD is admitted with left-sided lower chest pain going to the back and pain while coughing.  She also  was complaining of shortness of breath.  In the ED.  She had a temperature of 98.3 blood pressure was 113/64.  Chest x-ray revealed a left lower lobe infiltrate.  She was started on the ceftriaxone and azithromycin.  I am asked to see the patient regarding her HIV care.  She [facility] and she is on Descovy and dolutegravir.  She states she is 100% compliant with medication.  She was seen by Dr[facility] Sampson [facility]oon [facility] but as he is no longer in [place] the question was whether she wanted to follow-up with me.  Patient states it is very convenient for her to go to the [facility] as it is close to her home.  Her case manager is Laurette Schimke.   She continues to smoke.  Is getting ready to be discharged now. Past Medical History:  Diagnosis Date  . Anxiety   . COPD (chronic obstructive pulmonary disease) (HCC)   . Depression   . Hearing loss   . HIV (human immunodeficiency virus infection) (HCC)     Past Surgical History:  Procedure Laterality Date  . CESAREAN SECTION    . COLONOSCOPY  2018    Social History   Tobacco Use  . Smoking status: Current Every Day Smoker    Last attempt to quit: 08/18/2016    Years since quitting: 1.8  . Smokeless tobacco: Never Used  Substance Use Topics  . Alcohol use: Yes  . Drug use: No    No family history on file.   Melene Muller ON 06/12/2018] amoxicillin-clavulanate  1 tablet Oral Q12H  . azithromycin  250 mg Oral Daily  . docusate sodium  100 mg Oral BID  . dolutegravir  50 mg Oral Daily  . emtricitabine-tenofovir AF  1 tablet Oral Daily  . enoxaparin (LOVENOX) injection  40 mg Subcutaneous Q24H  . feeding  supplement (ENSURE ENLIVE)  237 mL Oral BID BM  . ipratropium-albuterol  3 mL Nebulization Q6H  . methylPREDNISolone (SOLU-MEDROL) injection  60 mg Intravenous Q24H  . multivitamin with minerals  1 tablet Oral Daily      Abtx:  Anti-infectives (From admission, onward)   Start     Dose/Rate Route Frequency Ordered Stop   06/12/18 1000  amoxicillin-clavulanate (AUGMENTIN) 875-125 MG per tablet 1 tablet     1 tablet Oral Every 12 hours 06/11/18 1113     06/12/18 0000  amoxicillin-clavulanate (AUGMENTIN) 875-125 MG tablet     1 tablet Oral Every 12 hours 06/11/18 1117     06/11/18 1300  cefTRIAXone (ROCEPHIN) 1 g in sodium chloride 0.9 % 100 mL IVPB  Status:  Discontinued     1 g 200 mL/hr over 30 Minutes Intravenous Every 24 hours 06/10/18 1253 06/11/18 1112   06/11/18 1200  cefTRIAXone (ROCEPHIN) 1 g in sodium chloride 0.9 % 100 mL IVPB     1 g 200 mL/hr over 30 Minutes Intravenous  Once 06/11/18 1112 06/11/18 1206   06/11/18 1000  emtricitabine-tenofovir AF (DESCOVY) 200-25 MG per tablet 1 tablet     1 tablet  Oral Daily 06/10/18 1252     06/11/18 1000  abacavir-dolutegravir-lamiVUDine (TRIUMEQ) 600-50-300 MG per tablet 1 tablet  Status:  Discontinued     1 tablet Oral Daily 06/10/18 1252 06/11/18 0846   06/11/18 1000  dolutegravir (TIVICAY) tablet 50 mg     50 mg Oral Daily 06/10/18 1252     06/11/18 1000  azithromycin (ZITHROMAX) tablet 250 mg     250 mg Oral Daily 06/10/18 1252 06/15/18 0959   06/11/18 0000  azithromycin (ZITHROMAX) 250 MG tablet        06/11/18 1117     06/10/18 1300  cefTRIAXone (ROCEPHIN) 1 g in sodium chloride 0.9 % 100 mL IVPB  Status:  Discontinued     1 g 200 mL/hr over 30 Minutes Intravenous Every 24 hours 06/10/18 1252 06/10/18 1253   06/10/18 1300  azithromycin (ZITHROMAX) tablet 500 mg     500 mg Oral Daily 06/10/18 1252 06/10/18 1518   06/10/18 1215  cefTRIAXone (ROCEPHIN) 1 g in sodium chloride 0.9 % 100 mL IVPB     1 g 200 mL/hr over 30 Minutes  Intravenous  Once 06/10/18 1209 06/10/18 1335   06/10/18 1215  sulfamethoxazole-trimethoprim (BACTRIM DS,SEPTRA DS) 800-160 MG per tablet 1 tablet     1 tablet Oral  Once 06/10/18 1209 06/10/18 1223   06/10/18 1215  doxycycline (VIBRA-TABS) tablet 100 mg     100 mg Oral  Once 06/10/18 1209 06/10/18 1224       Review of Systems: Review of Systems  Constitutional: Positive for chills and malaise/fatigue. Negative for fever and weight loss.  HENT: Positive for hearing loss. Negative for congestion and sore throat.        Has hearing aid  Eyes: Negative for blurred vision and photophobia.  Respiratory: Positive for cough and shortness of breath. Negative for sputum production.   Cardiovascular: Positive for chest pain. Negative for orthopnea and leg swelling.  Gastrointestinal: Positive for diarrhea.       Had 3 loose stools yesterday  Genitourinary: Negative for dysuria and frequency.  Musculoskeletal: Positive for myalgias.  Skin: Negative for rash.  Neurological: Negative for dizziness and focal weakness.  Psychiatric/Behavioral: Negative for substance abuse.  Has not had alcohol in 1 year. Used to do illegal drugs in the past   Allergies  Allergen Reactions  . Mushroom Extract Complex     Reaction type: Headache  . Other     (patient thinks Vancomycin) A shot they gave me and I wound up in ICU     OBJECTIVE: Blood pressure (!) 151/86, pulse 67, temperature 98.4 F (36.9 C), temperature source Oral, resp. rate 20, height 5\' 3"  (1.6 m), weight 44.9 kg, SpO2 100 %.  Physical Exam  Constitutional: She is oriented to person, place, and time.  Non-toxic appearance. No distress.  Thin built  HENT:  Mouth/Throat: No oropharyngeal exudate.  Hearing aid  Eyes: Pupils are equal, round, and reactive to light. EOM are normal.  Neck: Normal range of motion. Neck supple. No thyromegaly present.  Cardiovascular: Normal rate and regular rhythm.  No murmur heard. Pulmonary/Chest:  Effort normal.  B/l air entry-no rhonchi  Abdominal: Soft. There is no tenderness.  Neurological: She is alert and oriented to person, place, and time.  No edema legs  Lab Results CBC    Component Value Date/Time   WBC 9.7 06/11/2018 0405   RBC 2.92 (L) 06/11/2018 0405   HGB 10.1 (L) 06/11/2018 0405   HCT 29.1 (L) 06/11/2018 0405  PLT 440 06/11/2018 0405   MCV 99.5 06/11/2018 0405   MCH 34.6 (H) 06/11/2018 0405   MCHC 34.8 06/11/2018 0405   RDW 15.4 (H) 06/11/2018 0405   LYMPHSABS 1.0 06/10/2018 1117   MONOABS 0.9 06/10/2018 1117   EOSABS 0.0 06/10/2018 1117   BASOSABS 0.0 06/10/2018 1117    CMP Latest Ref Rng & Units 06/11/2018 06/10/2018 11/17/2017  Glucose 70 - 99 mg/dL 865(H) 93 846(N)  BUN 6 - 20 mg/dL 10 9 <6(E)  Creatinine 0.44 - 1.00 mg/dL 9.52 8.41(L) 2.44  Sodium 135 - 145 mmol/L 136 134(L) 131(L)  Potassium 3.5 - 5.1 mmol/L 3.6 3.3(L) 3.7  Chloride 98 - 111 mmol/L 104 99 101  CO2 22 - 32 mmol/L 24 25 21(L)  Calcium 8.9 - 10.3 mg/dL 8.1(L) 8.8(L) 8.5(L)      Microbiology: Recent Results (from the past 240 hour(s))  Blood culture (routine x 2)     Status: None (Preliminary result)   Collection Time: 06/10/18 12:44 PM  Result Value Ref Range Status   Specimen Description BLOOD LEFT FA  Final   Special Requests   Final    BOTTLES DRAWN AEROBIC AND ANAEROBIC Blood Culture adequate volume   Culture   Final    NO GROWTH < 24 HOURS Performed at [facility], [address], [place], [place] [address]    Report Status PENDING  Incomplete  Blood culture (routine x 2)     Status: None (Preliminary result)   Collection Time: 06/10/18 12:44 PM  Result Value Ref Range Status   Specimen Description BLOOD RIGHT FA  Final   Special Requests   Final    BOTTLES DRAWN AEROBIC AND ANAEROBIC Blood Culture adequate volume   Culture   Final    NO GROWTH < 24 HOURS Performed at [facility], [address], [place], [place] [address]    Report Status  PENDING  Incomplete    Radiographs and labs were personally reviewed by me.   Assessment and Plan 57 year old female with history of COPD and HIV presents with chest pain cough and shortness of breath.  Left lower lobe infiltrate.  On ceftriaxone and azithromycin.  As she is feeling better she is getting discharged today on Augmentin and oral azithromycin.  HIV do not have any records in the system.  [facility]  She is on Descovy and tivicay.  Called her case manager Laurette Schimke and left a message for her.  Patient would like to follow-up with [facility] as it close to her home.  Gave her the option of following with me as outpatient.  She will discuss with her case Production designer, theatre/television/film. Lynn Ito, MD  06/11/2018, 12:27 PM  Note:  This document was prepared using Dragon voice recognition software and may include unintentional dictation errors.

## 2018-06-11 NOTE — Progress Notes (Addendum)
Initial Nutrition Assessment  DOCUMENTATION CODES:   Severe malnutrition in context of chronic illness  INTERVENTION:  Ensure Enlive BID. Each supplement provides 350 calories and 20 grams of protein.  MVI daily  Counseling on increasing calories and protein at home to help with weight gain.   NUTRITION DIAGNOSIS:   Severe Malnutrition related to chronic illness(HIV, COPD) as evidenced by severe fat depletion, severe muscle depletion.   GOAL:   Patient will meet greater than or equal to 90% of their needs   MONITOR:   PO intake, Supplement acceptance, Weight trends, Labs  REASON FOR ASSESSMENT:   Low BMI    ASSESSMENT:   57 yo female w/ PMHx of HIV, COPD, and anxiety admitted for evaluation of cough and shortness of breath.   Visited patient in room today. She reports having a good appetite while in the hospital and PTA. She says she has eaten 100% of meals during her stay except for breakfast because it had mushrooms in it; she says mushrooms give her a headache. At home patient says she eats a lot of fruit, oatmeal, and meat and vegetables at night. She reports drinking Boost Plus at home as well as warm whole milk. She takes a multivitamin for women over 50. For exercise she reports taking long walks, which she enjoys. She says her weight was higher "a long time ago before getting sick" but that her current weight is stable. She is actively trying to gain weight. Physical exam yielded severe fat and muscle depletions as well as poor dentition. Unsure of patient's compliance with HIV medications. Counseled patient on increasing sources of calories and protein in the diet to assist with weight gain.   Medications reviewed and include: amoxicillin, azithromycin, Colace, Lovenox Labs reviewed: Glucose 184, Calcium 8.1, RBC 2.92, Hbg 10.1, Hct 29.1   NUTRITION - FOCUSED PHYSICAL EXAM:    Most Recent Value  Orbital Region  Moderate depletion  Upper Arm Region  Severe  depletion  Thoracic and Lumbar Region  Severe depletion  Buccal Region  Moderate depletion  Temple Region  Severe depletion  Clavicle Bone Region  Severe depletion  Clavicle and Acromion Bone Region  Severe depletion  Scapular Bone Region  Severe depletion  Dorsal Hand  Severe depletion  Patellar Region  Severe depletion  Anterior Thigh Region  Severe depletion  Posterior Calf Region  Severe depletion  Edema (RD Assessment)  None  Hair  Reviewed  Eyes  Reviewed  Mouth  Reviewed [poor dentition]  Skin  Reviewed  Nails  Reviewed       Diet Order:   Diet Order            Diet regular Room service appropriate? Yes; Fluid consistency: Thin  Diet effective now              EDUCATION NEEDS:   Education needs have been addressed  Skin:  Skin Assessment: Reviewed RN Assessment  Last BM:   9/19  Height:   Ht Readings from Last 1 Encounters:  06/10/18 5\' 3"  (1.6 m)    Weight:   Wt Readings from Last 1 Encounters:  06/11/18 44.9 kg    Ideal Body Weight:  52.3 kg  BMI:  Body mass index is 17.53 kg/m.  Estimated Nutritional Needs:   Kcal:  1500-1600  Protein:  80-90  Fluid:  >1.5 L     Latishia Suitt, Dietetic Intern [phone]

## 2018-06-12 LAB — HEMOGLOBIN A1C
HEMOGLOBIN A1C: 5.5 % (ref 4.8–5.6)
Mean Plasma Glucose: 111 mg/dL

## 2018-06-15 LAB — CULTURE, BLOOD (ROUTINE X 2)
Culture: NO GROWTH
Culture: NO GROWTH
Special Requests: ADEQUATE
Special Requests: ADEQUATE

## 2018-07-16 ENCOUNTER — Other Ambulatory Visit: Payer: Self-pay | Admitting: Registered Nurse

## 2018-07-16 DIAGNOSIS — Z8701 Personal history of pneumonia (recurrent): Secondary | ICD-10-CM

## 2018-07-16 DIAGNOSIS — R9389 Abnormal findings on diagnostic imaging of other specified body structures: Secondary | ICD-10-CM

## 2018-07-16 DIAGNOSIS — F17209 Nicotine dependence, unspecified, with unspecified nicotine-induced disorders: Secondary | ICD-10-CM

## 2018-07-26 ENCOUNTER — Encounter: Payer: Self-pay | Admitting: General Surgery

## 2018-11-04 ENCOUNTER — Other Ambulatory Visit: Payer: Self-pay | Admitting: Registered Nurse

## 2018-11-04 DIAGNOSIS — Z1231 Encounter for screening mammogram for malignant neoplasm of breast: Secondary | ICD-10-CM

## 2019-03-01 ENCOUNTER — Emergency Department: Payer: Medicare HMO

## 2019-03-01 ENCOUNTER — Other Ambulatory Visit: Payer: Self-pay

## 2019-03-01 ENCOUNTER — Encounter: Payer: Self-pay | Admitting: *Deleted

## 2019-03-01 ENCOUNTER — Emergency Department
Admission: EM | Admit: 2019-03-01 | Discharge: 2019-03-01 | Disposition: A | Discharge status: Home / Self Care | Payer: Medicare HMO | Attending: Emergency Medicine | Admitting: Emergency Medicine

## 2019-03-01 DIAGNOSIS — R0789 Other chest pain: Secondary | ICD-10-CM | POA: Insufficient documentation

## 2019-03-01 DIAGNOSIS — R1110 Vomiting, unspecified: Secondary | ICD-10-CM | POA: Insufficient documentation

## 2019-03-01 DIAGNOSIS — Z20828 Contact with and (suspected) exposure to other viral communicable diseases: Secondary | ICD-10-CM | POA: Insufficient documentation

## 2019-03-01 DIAGNOSIS — R079 Chest pain, unspecified: Secondary | ICD-10-CM | POA: Diagnosis present

## 2019-03-01 DIAGNOSIS — R5383 Other fatigue: Secondary | ICD-10-CM | POA: Insufficient documentation

## 2019-03-01 DIAGNOSIS — B20 Human immunodeficiency virus [HIV] disease: Secondary | ICD-10-CM | POA: Diagnosis not present

## 2019-03-01 DIAGNOSIS — R042 Hemoptysis: Secondary | ICD-10-CM | POA: Diagnosis not present

## 2019-03-01 DIAGNOSIS — F17200 Nicotine dependence, unspecified, uncomplicated: Secondary | ICD-10-CM | POA: Diagnosis not present

## 2019-03-01 DIAGNOSIS — R1013 Epigastric pain: Secondary | ICD-10-CM | POA: Insufficient documentation

## 2019-03-01 DIAGNOSIS — Z79899 Other long term (current) drug therapy: Secondary | ICD-10-CM | POA: Diagnosis not present

## 2019-03-01 DIAGNOSIS — J449 Chronic obstructive pulmonary disease, unspecified: Secondary | ICD-10-CM | POA: Diagnosis not present

## 2019-03-01 DIAGNOSIS — K2900 Acute gastritis without bleeding: Secondary | ICD-10-CM

## 2019-03-01 LAB — CBC WITH DIFFERENTIAL/PLATELET
Abs Immature Granulocytes: 0.02 10*3/uL (ref 0.00–0.07)
Basophils Absolute: 0.1 10*3/uL (ref 0.0–0.1)
Basophils Relative: 1 %
Eosinophils Absolute: 0.2 10*3/uL (ref 0.0–0.5)
Eosinophils Relative: 3 %
HCT: 35 % — ABNORMAL LOW (ref 36.0–46.0)
Hemoglobin: 11.9 g/dL — ABNORMAL LOW (ref 12.0–15.0)
Immature Granulocytes: 0 %
Lymphocytes Relative: 22 %
Lymphs Abs: 1.5 10*3/uL (ref 0.7–4.0)
MCH: 30.8 pg (ref 26.0–34.0)
MCHC: 34 g/dL (ref 30.0–36.0)
MCV: 90.7 fL (ref 80.0–100.0)
Monocytes Absolute: 0.4 10*3/uL (ref 0.1–1.0)
Monocytes Relative: 6 %
Neutro Abs: 4.7 10*3/uL (ref 1.7–7.7)
Neutrophils Relative %: 68 %
Platelets: 262 10*3/uL (ref 150–400)
RBC: 3.86 MIL/uL — ABNORMAL LOW (ref 3.87–5.11)
RDW: 20.1 % — ABNORMAL HIGH (ref 11.5–15.5)
WBC: 6.9 10*3/uL (ref 4.0–10.5)
nRBC: 0 % (ref 0.0–0.2)

## 2019-03-01 LAB — COMPREHENSIVE METABOLIC PANEL
ALT: 28 U/L (ref 0–44)
AST: 126 U/L — ABNORMAL HIGH (ref 15–41)
Albumin: 3.5 g/dL (ref 3.5–5.0)
Alkaline Phosphatase: 112 U/L (ref 38–126)
Anion gap: 11 (ref 5–15)
BUN: 6 mg/dL (ref 6–20)
CO2: 26 mmol/L (ref 22–32)
Calcium: 8.7 mg/dL — ABNORMAL LOW (ref 8.9–10.3)
Chloride: 97 mmol/L — ABNORMAL LOW (ref 98–111)
Creatinine, Ser: 0.79 mg/dL (ref 0.44–1.00)
GFR calc Af Amer: >60 mL/min (ref >60)
GFR calc non Af Amer: >60 mL/min (ref >60)
Glucose, Bld: 114 mg/dL — ABNORMAL HIGH (ref 70–99)
Potassium: 2.9 mmol/L — ABNORMAL LOW (ref 3.5–5.1)
Sodium: 134 mmol/L — ABNORMAL LOW (ref 135–145)
Total Bilirubin: 1.1 mg/dL (ref 0.3–1.2)
Total Protein: 10 g/dL — ABNORMAL HIGH (ref 6.5–8.1)

## 2019-03-01 LAB — CBC
HCT: 35.4 % — ABNORMAL LOW (ref 36.0–46.0)
Hemoglobin: 11.7 g/dL — ABNORMAL LOW (ref 12.0–15.0)
MCH: 29.4 pg (ref 26.0–34.0)
MCHC: 33.1 g/dL (ref 30.0–36.0)
MCV: 88.9 fL (ref 80.0–100.0)
Platelets: 260 10*3/uL (ref 150–400)
RBC: 3.98 MIL/uL (ref 3.87–5.11)
RDW: 19.9 % — ABNORMAL HIGH (ref 11.5–15.5)
WBC: 6.9 10*3/uL (ref 4.0–10.5)
nRBC: 0 % (ref 0.0–0.2)

## 2019-03-01 LAB — TYPE AND SCREEN
ABO/RH(D): B POS
Antibody Screen: NEGATIVE

## 2019-03-01 LAB — TROPONIN I: Troponin I: <0.03 ng/mL (ref <0.03)

## 2019-03-01 LAB — LIPASE, BLOOD: Lipase: 30 U/L (ref 11–51)

## 2019-03-01 MED ORDER — METOCLOPRAMIDE HCL 10 MG PO TABS: 10.0000 mg | ORAL_TABLET | Freq: Four times a day (QID) | ORAL | 0 refills | Status: AC | PRN

## 2019-03-01 MED ORDER — DIPHENHYDRAMINE HCL 50 MG/ML IJ SOLN
25.0000 mg | Freq: Once | INTRAMUSCULAR | Status: AC
  Administered 2019-03-01: 25 mg via INTRAVENOUS
  Filled 2019-03-01: qty 1

## 2019-03-01 MED ORDER — ALUMINUM-MAGNESIUM-SIMETHICONE 200-200-20 MG/5ML PO SUSP: 30.0000 mL | Freq: Three times a day (TID) | ORAL | 0 refills | Status: AC

## 2019-03-01 MED ORDER — SODIUM CHLORIDE 0.9 % IV BOLUS
1000.0000 mL | Freq: Once | INTRAVENOUS | Status: AC
  Administered 2019-03-01: 1000 mL via INTRAVENOUS

## 2019-03-01 MED ORDER — FAMOTIDINE 20 MG PO TABS: 20.0000 mg | ORAL_TABLET | Freq: Two times a day (BID) | ORAL | 0 refills | Status: DC

## 2019-03-01 MED ORDER — METOCLOPRAMIDE HCL 5 MG/ML IJ SOLN
10.0000 mg | Freq: Once | INTRAMUSCULAR | Status: AC
  Administered 2019-03-01: 10 mg via INTRAVENOUS
  Filled 2019-03-01: qty 2

## 2019-03-01 MED ORDER — IOHEXOL 350 MG/ML SOLN
75.0000 mL | Freq: Once | INTRAVENOUS | Status: AC | PRN
  Administered 2019-03-01: 75 mL via INTRAVENOUS
  Filled 2019-03-01: qty 75

## 2019-03-01 NOTE — ED Provider Notes (Signed)
[facility] Emergency Department Provider Note  ____________________________________________  Time seen: Approximately 6:30 PM  I have reviewed the triage vital signs and the nursing notes.   HISTORY  Chief Complaint Hemoptysis and Chest Pain    HPI Patricia Luna is a 58 y.o. female with a history of anxiety depression HIV and COPD who reports chest pain that started yesterday, central chest and epigastric.  Associated with fatigue congestion vomiting and hemoptysis.  Denies black or bloody stool.  Denies fever or chills or dizziness.  Reports compliance with her antiretrovirals and other medications.  Pain is severe, radiates to the lower back, no aggravating or alleviating factors.      Past Medical History:  Diagnosis Date  . Anxiety   . COPD (chronic obstructive pulmonary disease) (HCC)   . Depression   . Hearing loss   . HIV (human immunodeficiency virus infection) [facility](HCC)      Patient Active Problem List   Diagnosis Date Noted  . Sepsis (HCC) 06/10/2018  . Rectal pain 04/13/2018     Past Surgical History:  Procedure Laterality Date  . CESAREAN SECTION    . COLONOSCOPY  2018     Prior to Admission medications   Medication Sig Start Date End Date Taking? Authorizing Provider  albuterol (PROVENTIL HFA;VENTOLIN HFA) 108 (90 Base) MCG/ACT inhaler Inhale 2 puffs into the lungs every 6 (six) hours as needed for wheezing or shortness of breath. 08/03/16   Willy Eddyobinson, Patrick, MD  aluminum-magnesium hydroxide-simethicone (MAALOX) 200-200-20 MG/5ML SUSP Take 30 mLs by mouth 4 (four) times daily -  before meals and at bedtime. 03/01/19   Sharman CheekStafford, Annalissa Murphey, MD  amoxicillin-clavulanate (AUGMENTIN) 875-125 MG tablet Take 1 tablet by mouth every 12 (twelve) hours. 06/12/18   Alford HighlandWieting, Richard, MD  azithromycin (ZITHROMAX) 250 MG tablet One tab po daily for three days 06/11/18   Alford HighlandWieting, Richard, MD  citalopram (CELEXA) 10 MG tablet Take 1 tablet by mouth  daily. 10/20/17   [provider]  DESCOVY 200-25 MG tablet Take 1 tablet by mouth daily.  04/01/18   [provider]  famotidine (PEPCID) 20 MG tablet Take 1 tablet (20 mg total) by mouth 2 (two) times daily. 03/01/19   Sharman CheekStafford, Jakai Risse, MD  feeding supplement, ENSURE ENLIVE, (ENSURE ENLIVE) LIQD Take 237 mLs by mouth 2 (two) times daily between meals. 06/11/18   Alford HighlandWieting, Richard, MD  fluticasone (FLOVENT HFA) 220 MCG/ACT inhaler Inhale 2 puffs into the lungs 2 (two) times daily. Rinse out mouth 06/11/18   Alford HighlandWieting, Richard, MD  metoCLOPramide (REGLAN) 10 MG tablet Take 1 tablet (10 mg total) by mouth every 6 (six) hours as needed. 03/01/19   Sharman CheekStafford, Tyiana Hill, MD  Multiple Vitamin (MULTIVITAMIN WITH MINERALS) TABS tablet Take 1 tablet by mouth daily. 06/12/18   Alford HighlandWieting, Richard, MD  predniSONE (DELTASONE) 10 MG tablet 3 tabs po day 1, 2 tabs po day2; 1 tab po day 3 06/12/18   Wieting, Richard, MD  TIVICAY 50 MG tablet Take 50 mg by mouth daily.  04/01/18   [provider]     Allergies Mushroom extract complex and Other   History reviewed. No pertinent family history.  Social History Social History   Tobacco Use  . Smoking status: Current Every Day Smoker    Last attempt to quit: 08/18/2016    Years since quitting: 2.5  . Smokeless tobacco: Never Used  Substance Use Topics  . Alcohol use: Yes  . Drug use: No    Review of Systems  Constitutional:   No fever or chills.  ENT:   No sore throat. No rhinorrhea. Cardiovascular:   Positive as above chest pain without syncope. Respiratory:   No dyspnea positive cough. Gastrointestinal: Positive as above abdominal pain and vomiting.  No constipation or diarrhea.    Musculoskeletal:   Negative for focal pain or swelling All other systems reviewed and are negative except as documented above in ROS and HPI.  ____________________________________________   PHYSICAL EXAM:  VITAL SIGNS: ED Triage Vitals  Enc Vitals  Group     BP 03/01/19 1553 (!) 135/92     Pulse Rate 03/01/19 1553 (!) 124     Resp 03/01/19 1553 18     Temp 03/01/19 1553 98.9 F (37.2 C)     Temp Source 03/01/19 1553 Oral     SpO2 03/01/19 1553 98 %     Weight 03/01/19 1553 95 lb (43.1 kg)     Height 03/01/19 1553 5\' 3"  (1.6 m)     Head Circumference --      Peak Flow --      Pain Score 03/01/19 1606 8     Pain Loc --      Pain Edu? --      Excl. in [place]? --     Vital signs reviewed, nursing assessments reviewed.   Constitutional:   Alert and oriented. Non-toxic appearance. Eyes:   Conjunctivae are normal. EOMI. PERRL. ENT      Head:   Normocephalic and atraumatic.      Nose:   No congestion/rhinnorhea.       Mouth/Throat:   MMM, no pharyngeal erythema. No peritonsillar mass.       Neck:   No meningismus. Full ROM. Hematological/Lymphatic/Immunilogical:   No cervical lymphadenopathy. Cardiovascular:   Tachycardia heart rate 120. Symmetric bilateral radial and DP pulses.  No murmurs. Cap refill less than 2 seconds. Respiratory:   Normal respiratory effort without tachypnea/retractions. Breath sounds are clear and equal bilaterally. No wheezes/rales/rhonchi. Gastrointestinal:   Soft with diffuse upper abdominal tenderness, worse in the epigastrium. Non distended. There is no CVA tenderness.  No rebound, rigidity, or guarding.  Musculoskeletal:   Normal range of motion in all extremities. No joint effusions.  No lower extremity tenderness.  No edema.  Chest wall tender to the touch anteriorly over the upper chest Neurologic:   Normal speech and language.  Motor grossly intact. No acute focal neurologic deficits are appreciated.  Skin:    Skin is warm, dry and intact. No rash noted.  No petechiae, purpura, or bullae.  ____________________________________________    LABS (pertinent positives/negatives) (all labs ordered are listed, but only abnormal results are displayed) Labs Reviewed  COMPREHENSIVE METABOLIC PANEL -  Abnormal; Notable for the following components:      Result Value   Sodium 134 (*)    Potassium 2.9 (*)    Chloride 97 (*)    Glucose, Bld 114 (*)    Calcium 8.7 (*)    Total Protein 10.0 (*)    AST 126 (*)    All other components within normal limits  CBC - Abnormal; Notable for the following components:   Hemoglobin 11.7 (*)    HCT 35.4 (*)    RDW 19.9 (*)    All other components within normal limits  CBC WITH DIFFERENTIAL/PLATELET - Abnormal; Notable for the following components:   RBC 3.86 (*)    Hemoglobin 11.9 (*)    HCT 35.0 (*)    RDW 20.1 (*)  All other components within normal limits  TROPONIN I  LIPASE, BLOOD  TYPE AND SCREEN   ____________________________________________   EKG  Interpreted by me Sinus tachycardia rate 117, normal axis and intervals.  Normal QRS ST segments and T waves.  No acute ischemic changes. ____________________________________________    RADIOLOGY  Ct Angio Chest Pe W And/or Wo Contrast  Result Date: 03/01/2019 CLINICAL DATA:  Chest pain.  Decreased appetite. EXAM: CT ANGIOGRAPHY CHEST CT ABDOMEN AND PELVIS WITH CONTRAST TECHNIQUE: Multidetector CT imaging of the chest was performed using the standard protocol during bolus administration of intravenous contrast. Multiplanar CT image reconstructions and MIPs were obtained to evaluate the vascular anatomy. Multidetector CT imaging of the abdomen and pelvis was performed using the standard protocol during bolus administration of intravenous contrast. CONTRAST:  75mL OMNIPAQUE IOHEXOL 350 MG/ML SOLN COMPARISON:  CT scan of November 19, 2017. FINDINGS: CTA CHEST FINDINGS Cardiovascular: Satisfactory opacification of the pulmonary arteries to the segmental level. No evidence of pulmonary embolism. Normal heart size. No pericardial effusion. Mediastinum/Nodes: No enlarged mediastinal, hilar, or axillary lymph nodes. Thyroid gland, trachea, and esophagus demonstrate no significant findings.  Lungs/Pleura: No pneumothorax or pleural effusion is noted. Emphysematous disease is noted in both upper lobes. No acute pulmonary disease is noted. Musculoskeletal: No chest wall abnormality. No acute or significant osseous findings. Review of the MIP images confirms the above findings. CT ABDOMEN and PELVIS FINDINGS Hepatobiliary: No gallstones or biliary dilatation is noted. Hepatic steatosis is noted. Pancreas: Unremarkable. No pancreatic ductal dilatation or surrounding inflammatory changes. Spleen: Normal in size without focal abnormality. Adrenals/Urinary Tract: Adrenal glands are unremarkable. Kidneys are normal, without renal calculi, focal lesion, or hydronephrosis. Bladder is unremarkable. Stomach/Bowel: Stomach is within normal limits. Appendix appears normal. No evidence of bowel wall thickening, distention, or inflammatory changes. Vascular/Lymphatic: Aortic atherosclerosis. No enlarged abdominal or pelvic lymph nodes. Reproductive: Uterus and bilateral adnexa are unremarkable. Other: No abdominal wall hernia or abnormality. No abdominopelvic ascites. Musculoskeletal: No acute or significant osseous findings. Review of the MIP images confirms the above findings. IMPRESSION: No definite evidence of pulmonary embolus. Hepatic steatosis. No other significant abnormality seen in the abdomen or pelvis. Aortic Atherosclerosis (ICD10-I70.0) and Emphysema (ICD10-J43.9). Electronically Signed   By: Lupita RaiderJames  Green Jr M.D.   On: 03/01/2019 19:24   Ct Abdomen Pelvis W Contrast  Result Date: 03/01/2019 CLINICAL DATA:  Chest pain.  Decreased appetite. EXAM: CT ANGIOGRAPHY CHEST CT ABDOMEN AND PELVIS WITH CONTRAST TECHNIQUE: Multidetector CT imaging of the chest was performed using the standard protocol during bolus administration of intravenous contrast. Multiplanar CT image reconstructions and MIPs were obtained to evaluate the vascular anatomy. Multidetector CT imaging of the abdomen and pelvis was performed  using the standard protocol during bolus administration of intravenous contrast. CONTRAST:  75mL OMNIPAQUE IOHEXOL 350 MG/ML SOLN COMPARISON:  CT scan of November 19, 2017. FINDINGS: CTA CHEST FINDINGS Cardiovascular: Satisfactory opacification of the pulmonary arteries to the segmental level. No evidence of pulmonary embolism. Normal heart size. No pericardial effusion. Mediastinum/Nodes: No enlarged mediastinal, hilar, or axillary lymph nodes. Thyroid gland, trachea, and esophagus demonstrate no significant findings. Lungs/Pleura: No pneumothorax or pleural effusion is noted. Emphysematous disease is noted in both upper lobes. No acute pulmonary disease is noted. Musculoskeletal: No chest wall abnormality. No acute or significant osseous findings. Review of the MIP images confirms the above findings. CT ABDOMEN and PELVIS FINDINGS Hepatobiliary: No gallstones or biliary dilatation is noted. Hepatic steatosis is noted. Pancreas: Unremarkable. No pancreatic ductal dilatation  or surrounding inflammatory changes. Spleen: Normal in size without focal abnormality. Adrenals/Urinary Tract: Adrenal glands are unremarkable. Kidneys are normal, without renal calculi, focal lesion, or hydronephrosis. Bladder is unremarkable. Stomach/Bowel: Stomach is within normal limits. Appendix appears normal. No evidence of bowel wall thickening, distention, or inflammatory changes. Vascular/Lymphatic: Aortic atherosclerosis. No enlarged abdominal or pelvic lymph nodes. Reproductive: Uterus and bilateral adnexa are unremarkable. Other: No abdominal wall hernia or abnormality. No abdominopelvic ascites. Musculoskeletal: No acute or significant osseous findings. Review of the MIP images confirms the above findings. IMPRESSION: No definite evidence of pulmonary embolus. Hepatic steatosis. No other significant abnormality seen in the abdomen or pelvis. Aortic Atherosclerosis (ICD10-I70.0) and Emphysema (ICD10-J43.9). Electronically Signed    By: Lupita RaiderJames  Green Jr M.D.   On: 03/01/2019 19:24    ____________________________________________   PROCEDURES Procedures  ____________________________________________  DIFFERENTIAL DIAGNOSIS   Pulmonary embolism, unlikely aortic dissection or mesenteric ischemia.  Possible peptic ulcer disease, bowel obstruction, perforation, diverticulitis, pancreatitis, appendicitis, intra-abdominal abscess  CLINICAL IMPRESSION / ASSESSMENT AND PLAN / ED COURSE  Medications ordered in the ED: Medications  sodium chloride 0.9 % bolus 1,000 mL (0 mLs Intravenous Stopped 03/01/19 1852)  metoCLOPramide (REGLAN) injection 10 mg (10 mg Intravenous Given 03/01/19 1709)  diphenhydrAMINE (BENADRYL) injection 25 mg (25 mg Intravenous Given 03/01/19 1710)  iohexol (OMNIPAQUE) 350 MG/ML injection 75 mL (75 mLs Intravenous Contrast Given 03/01/19 1852)    Pertinent labs & imaging results that were available during my care of the patient were reviewed by me and considered in my medical decision making (see chart for details).  Patricia Luna was evaluated in Emergency Department on 03/01/2019 for the symptoms described in the history of present illness. She was evaluated in the context of the global COVID-19 pandemic, which necessitated consideration that the patient might be at risk for infection with the SARS-CoV-2 virus that causes COVID-19. Institutional protocols and algorithms that pertain to the evaluation of patients at risk for COVID-19 are in a state of rapid change based on information released by regulatory bodies including the CDC and federal and state organizations. These policies and algorithms were followed during the patient's care in the ED.   Patient with a history of HIV, on antiretrovirals, presents with chest pain and epigastric pain.  Abdominal exam is concerning for intra-abdominal process most likely.  Her tachycardia and syndrome are also suggestive of PE.  Labs are overall unremarkable except for  hypokalemia which is going to be replaced in the ED.  She is not lymphopenic on differential..  I will obtain a CT scan of the chest abdomen and pelvis to further evaluate.    ----------------------------------------- 7:42 PM on 03/01/2019 -----------------------------------------  Work-up negative.  Potassium supplemented in the ED.  No evidence of PE dissection or acute intra-abdominal process.  Most likely gastritis.  Will continue treating with antacid regimen, follow-up with primary care.  Will send an outpatient COVID swab as well given her nonspecific symptoms at this point.     ____________________________________________   FINAL CLINICAL IMPRESSION(S) / ED DIAGNOSES    Final diagnoses:  Atypical chest pain  Acute gastritis without hemorrhage, unspecified gastritis type  Epigastric pain     ED Discharge Orders         Ordered    aluminum-magnesium hydroxide-simethicone (MAALOX) 200-200-20 MG/5ML SUSP  3 times daily before meals & bedtime     03/01/19 1941    famotidine (PEPCID) 20 MG tablet  2 times daily     03/01/19 1941  metoCLOPramide (REGLAN) 10 MG tablet  Every 6 hours PRN     03/01/19 1941          Portions of this note were generated with dragon dictation software. Dictation errors may occur despite best attempts at proofreading.   Sharman Cheek, MD 03/01/19 1942

## 2019-03-01 NOTE — ED Notes (Signed)
Son called and given update at this time.

## 2019-03-01 NOTE — ED Notes (Signed)
Sent urine specimen and rainbow with T&S to lab.

## 2019-03-01 NOTE — ED Triage Notes (Addendum)
PT to ED reporting coughing and vomiting blood since yesterday with chest pain. No SOB but increased fatigue, congestion and decreased appetite with intermittent headaches.

## 2019-03-01 NOTE — Discharge Instructions (Addendum)
Your lab tests and CT scans today were all okay. Your symptoms may be due to stomach irritation. Continue taking your medications and take the medications we prescribe to help soothe your stomach.  It may be helpful to stick to a bland diet for the next week.  There is a chance that your symptoms are due to coronavirus infection given the current pandemic.  We will send a test for this today and you will receive a call with the results in the next few days.  In the meantime you should isolate at home and avoid contact with other people in case you have it.     Person Under Monitoring Name: Patricia Luna  Location: [address] [place] [id number]   Infection Prevention Recommendations for Individuals Confirmed to have, or Being Evaluated for, 2019 Novel Coronavirus (COVID-19) Infection Who Receive Care at Home  Individuals who are confirmed to have, or are being evaluated for, COVID-19 should follow the prevention steps below until a healthcare provider or local or state health department says they can return to normal activities.  Stay home except to get medical care You should restrict activities outside your home, except for getting medical care. Do not go to work, school, or public areas, and do not use public transportation or taxis.  Call ahead before visiting your doctor Before your medical appointment, call the healthcare provider and tell them that you have, or are being evaluated for, COVID-19 infection. This will help the healthcare providers office take steps to keep other people from getting infected. Ask your healthcare provider to call the local or state health department.  Monitor your symptoms Seek prompt medical attention if your illness is worsening (e.g., difficulty breathing). Before going to your medical appointment, call the healthcare provider and tell them that you have, or are being evaluated for, COVID-19 infection. Ask your healthcare provider  to call the local or state health department.  Wear a facemask You should wear a facemask that covers your nose and mouth when you are in the same room with other people and when you visit a healthcare provider. People who live with or visit you should also wear a facemask while they are in the same room with you.  Separate yourself from other people in your home As much as possible, you should stay in a different room from other people in your home. Also, you should use a separate bathroom, if available.  Avoid sharing household items You should not share dishes, drinking glasses, cups, eating utensils, towels, bedding, or other items with other people in your home. After using these items, you should wash them thoroughly with soap and water.  Cover your coughs and sneezes Cover your mouth and nose with a tissue when you cough or sneeze, or you can cough or sneeze into your sleeve. Throw used tissues in a lined trash can, and immediately wash your hands with soap and water for at least 20 seconds or use an alcohol-based hand rub.  Wash your [employer]hands Wash your hands often and thoroughly with soap and water for at least 20 seconds. You can use an alcohol-based hand sanitizer if soap and water are not available and if your hands are not visibly dirty. Avoid touching your eyes, nose, and mouth with unwashed hands.   Prevention Steps for Caregivers and Household Members of Individuals Confirmed to have, or Being Evaluated for, COVID-19 Infection Being Cared for in the Home  If you live with, or  provide care at home for, a person confirmed to have, or being evaluated for, COVID-19 infection please follow these guidelines to prevent infection:  Follow healthcare providers instructions Make sure that you understand and can help the patient follow any healthcare provider instructions for all care.  Provide for the patients basic needs You should help the patient with basic needs in the  home and provide support for getting groceries, prescriptions, and other personal needs.  Monitor the patients symptoms If they are getting sicker, call his or her medical provider and tell them that the patient has, or is being evaluated for, COVID-19 infection. This will help the healthcare providers office take steps to keep other people from getting infected. Ask the healthcare provider to call the local or state health department.  Limit the number of people who have contact with the patient If possible, have only one caregiver for the patient. Other household members should stay in another home or place of residence. If this is not possible, they should stay in another room, or be separated from the patient as much as possible. Use a separate bathroom, if available. Restrict visitors who do not have an essential need to be in the home.  Keep older adults, very young children, and other sick people away from the patient Keep older adults, very young children, and those who have compromised immune systems or chronic health conditions away from the patient. This includes people with chronic heart, lung, or kidney conditions, diabetes, and cancer.  Ensure good ventilation Make sure that shared spaces in the home have good air flow, such as from an air conditioner or an opened window, weather permitting.  Wash your hands often Wash your hands often and thoroughly with soap and water for at least 20 seconds. You can use an alcohol based hand sanitizer if soap and water are not available and if your hands are not visibly dirty. Avoid touching your eyes, nose, and mouth with unwashed hands. Use disposable paper towels to dry your hands. If not available, use dedicated cloth towels and replace them when they become wet.  Wear a facemask and gloves Wear a disposable facemask at all times in the room and gloves when you touch or have contact with the patients blood, body fluids, and/or  secretions or excretions, such as sweat, saliva, sputum, nasal mucus, vomit, urine, or feces.  Ensure the mask fits over your nose and mouth tightly, and do not touch it during use. Throw out disposable facemasks and gloves after using them. Do not reuse. Wash your hands immediately after removing your facemask and gloves. If your personal clothing becomes contaminated, carefully remove clothing and launder. Wash your hands after handling contaminated clothing. Place all used disposable facemasks, gloves, and other waste in a lined container before disposing them with other household waste. Remove gloves and wash your hands immediately after handling these items.  Do not share dishes, glasses, or other household items with the patient Avoid sharing household items. You should not share dishes, drinking glasses, cups, eating utensils, towels, bedding, or other items with a patient who is confirmed to have, or being evaluated for, COVID-19 infection. After the person uses these items, you should wash them thoroughly with soap and water.  Wash laundry thoroughly Immediately remove and wash clothes or bedding that have blood, body fluids, and/or secretions or excretions, such as sweat, saliva, sputum, nasal mucus, vomit, urine, or feces, on them. Wear gloves when handling laundry from the patient. Read and follow  directions on labels of laundry or clothing items and detergent. In general, wash and dry with the warmest temperatures recommended on the label.  Clean all areas the individual has used often Clean all touchable surfaces, such as counters, tabletops, doorknobs, bathroom fixtures, toilets, phones, keyboards, tablets, and bedside tables, every day. Also, clean any surfaces that may have blood, body fluids, and/or secretions or excretions on them. Wear gloves when cleaning surfaces the patient has come in contact with. Use a diluted bleach solution (e.g., dilute bleach with 1 part bleach and 10  parts water) or a household disinfectant with a label that says EPA-registered for coronaviruses. To make a bleach solution at home, add 1 tablespoon of bleach to 1 quart (4 cups) of water. For a larger supply, add  cup of bleach to 1 gallon (16 cups) of water. Read labels of cleaning products and follow recommendations provided on product labels. Labels contain instructions for safe and effective use of the cleaning product including precautions you should take when applying the product, such as wearing gloves or eye protection and making sure you have good ventilation during use of the product. Remove gloves and wash hands immediately after cleaning.  Monitor yourself for signs and symptoms of illness Caregivers and household members are considered close contacts, should monitor their health, and will be asked to limit movement outside of the home to the extent possible. Follow the monitoring steps for close contacts listed on the symptom monitoring form.   ? If you have additional questions, contact your local health department or call the epidemiologist on call at [phone] (available 24/7). ? This guidance is subject to change. For the most up-to-date guidance from [facility], please refer to their website: TripMetro.huhttps://www.cdc.gov/coronavirus/2019-ncov/hcp/guidance-prevent-spread.html

## 2019-03-03 LAB — NOVEL CORONAVIRUS, NAA (HOSP ORDER, SEND-OUT TO REF LAB; TAT 18-24 HRS): SARS-CoV-2, NAA: NOT DETECTED

## 2019-03-04 ENCOUNTER — Telehealth: Payer: Self-pay | Admitting: Emergency Medicine

## 2019-03-04 NOTE — Telephone Encounter (Signed)
Called patient and gave covid 19 result --negative

## 2019-10-06 DIAGNOSIS — J449 Chronic obstructive pulmonary disease, unspecified: Secondary | ICD-10-CM | POA: Diagnosis not present

## 2019-10-13 DIAGNOSIS — H9190 Unspecified hearing loss, unspecified ear: Secondary | ICD-10-CM | POA: Diagnosis not present

## 2019-10-13 DIAGNOSIS — D649 Anemia, unspecified: Secondary | ICD-10-CM | POA: Diagnosis not present

## 2019-12-12 DIAGNOSIS — F17200 Nicotine dependence, unspecified, uncomplicated: Secondary | ICD-10-CM | POA: Diagnosis not present

## 2019-12-15 DIAGNOSIS — Z1211 Encounter for screening for malignant neoplasm of colon: Secondary | ICD-10-CM | POA: Diagnosis not present

## 2019-12-15 DIAGNOSIS — D649 Anemia, unspecified: Secondary | ICD-10-CM | POA: Diagnosis not present

## 2020-04-24 ENCOUNTER — Other Ambulatory Visit: Payer: Self-pay | Admitting: Registered Nurse

## 2020-04-24 DIAGNOSIS — Z1231 Encounter for screening mammogram for malignant neoplasm of breast: Secondary | ICD-10-CM

## 2020-08-11 ENCOUNTER — Emergency Department
Admission: EM | Admit: 2020-08-11 | Discharge: 2020-08-11 | Disposition: A | Discharge status: Home / Self Care | Payer: Medicare Other | Attending: Emergency Medicine | Admitting: Emergency Medicine

## 2020-08-11 ENCOUNTER — Encounter: Payer: Self-pay | Admitting: Emergency Medicine

## 2020-08-11 ENCOUNTER — Other Ambulatory Visit: Payer: Self-pay

## 2020-08-11 DIAGNOSIS — J449 Chronic obstructive pulmonary disease, unspecified: Secondary | ICD-10-CM | POA: Insufficient documentation

## 2020-08-11 DIAGNOSIS — K134 Granuloma and granuloma-like lesions of oral mucosa: Secondary | ICD-10-CM | POA: Insufficient documentation

## 2020-08-11 DIAGNOSIS — Z7951 Long term (current) use of inhaled steroids: Secondary | ICD-10-CM | POA: Diagnosis not present

## 2020-08-11 DIAGNOSIS — K0889 Other specified disorders of teeth and supporting structures: Secondary | ICD-10-CM | POA: Diagnosis present

## 2020-08-11 DIAGNOSIS — F17210 Nicotine dependence, cigarettes, uncomplicated: Secondary | ICD-10-CM | POA: Diagnosis not present

## 2020-08-11 MED ORDER — LIDOCAINE VISCOUS HCL 2 % MT SOLN
15.0000 mL | Freq: Once | OROMUCOSAL | Status: AC
  Administered 2020-08-11: 15 mL via OROMUCOSAL
  Filled 2020-08-11: qty 15

## 2020-08-11 MED ORDER — LIDOCAINE VISCOUS HCL 2 % MT SOLN: 5.0000 mL | Freq: Four times a day (QID) | OROMUCOSAL | 0 refills | Status: DC | PRN

## 2020-08-11 NOTE — ED Notes (Signed)
Pt has a red bump on the roof of her mouth - states it has been present for weeks. Pt also states she wears dentures and they further irritate this area. Pt c/o pain with eating

## 2020-08-11 NOTE — Discharge Instructions (Addendum)
Call the ENT doctor listed on your discharge care instructions on Monday morning.  Tell them you are follow-up in emergency room.

## 2020-08-11 NOTE — ED Provider Notes (Signed)
[facility] Emergency Department Provider Note   ____________________________________________   First MD Initiated Contact with Patient 08/11/20 1134     (approximate)  I have reviewed the triage vital signs and the nursing notes.   HISTORY  Chief Complaint Dental Pain    HPI Patricia Luna is a 59 y.o. female patient presents with a lesion to the roof of her mouth causing pain and ability to wear dentures.  Patient onset of complaints approximately 1 month.  Patient has not discussed her complaint of any medical personnel until today.  Patient rates her pain as a 9/10.  Patient described the pain as "sore".  No palliative measure for complaint.         Past Medical History:  Diagnosis Date  . Anxiety   . COPD (chronic obstructive pulmonary disease) (HCC)   . Depression   . Hearing loss   . HIV (human immunodeficiency virus infection) [facility])     Patient Active Problem List   Diagnosis Date Noted  . Sepsis (HCC) 06/10/2018  . Rectal pain 04/13/2018    Past Surgical History:  Procedure Laterality Date  . CESAREAN SECTION    . COLONOSCOPY  2018    Prior to Admission medications   Medication Sig Start Date End Date Taking? Authorizing Provider  albuterol (PROVENTIL HFA;VENTOLIN HFA) 108 (90 Base) MCG/ACT inhaler Inhale 2 puffs into the lungs every 6 (six) hours as needed for wheezing or shortness of breath. 08/03/16   Willy Eddy, MD  aluminum-magnesium hydroxide-simethicone (MAALOX) 200-200-20 MG/5ML SUSP Take 30 mLs by mouth 4 (four) times daily -  before meals and at bedtime. 03/01/19   Sharman Cheek, MD  amoxicillin-clavulanate (AUGMENTIN) 875-125 MG tablet Take 1 tablet by mouth every 12 (twelve) hours. 06/12/18   Alford Highland, MD  azithromycin (ZITHROMAX) 250 MG tablet One tab po daily for three days 06/11/18   Alford Highland, MD  citalopram (CELEXA) 10 MG tablet Take 1 tablet by mouth daily. 10/20/17   [provider]  DESCOVY 200-25 MG tablet Take 1 tablet by mouth daily.  04/01/18   [provider]  famotidine (PEPCID) 20 MG tablet Take 1 tablet (20 mg total) by mouth 2 (two) times daily. 03/01/19   Sharman Cheek, MD  feeding supplement, ENSURE ENLIVE, (ENSURE ENLIVE) LIQD Take 237 mLs by mouth 2 (two) times daily between meals. 06/11/18   Alford Highland, MD  fluticasone (FLOVENT HFA) 220 MCG/ACT inhaler Inhale 2 puffs into the lungs 2 (two) times daily. Rinse out mouth 06/11/18   Alford Highland, MD  lidocaine (XYLOCAINE) 2 % solution Use as directed 5 mLs in the mouth or throat every 6 (six) hours as needed for mouth pain. 08/11/20   Joni Reining, PA-C  metoCLOPramide (REGLAN) 10 MG tablet Take 1 tablet (10 mg total) by mouth every 6 (six) hours as needed. 03/01/19   Sharman Cheek, MD  Multiple Vitamin (MULTIVITAMIN WITH MINERALS) TABS tablet Take 1 tablet by mouth daily. 06/12/18   Alford Highland, MD  predniSONE (DELTASONE) 10 MG tablet 3 tabs po day 1, 2 tabs po day2; 1 tab po day 3 06/12/18   Wieting, Richard, MD  TIVICAY 50 MG tablet Take 50 mg by mouth daily.  04/01/18   [provider]    Allergies Mushroom extract complex and Other  History reviewed. No pertinent family history.  Social History Social History   Tobacco Use  . Smoking status: Current Every Day Smoker    Last attempt to  quit: 08/18/2016    Years since quitting: 3.9  . Smokeless tobacco: Never Used  Substance Use Topics  . Alcohol use: Yes  . Drug use: No    Review of Systems  Constitutional: No fever/chills Eyes: No visual changes. ENT: Mouth pain.  Hearing loss. Cardiovascular: Denies chest pain. Respiratory: Denies shortness of breath. Gastrointestinal: No abdominal pain.  No nausea, no vomiting.  No diarrhea.  No constipation. Genitourinary: Negative for dysuria. Musculoskeletal: Negative for back pain. Skin: Negative for rash. Neurological: Negative for headaches, focal weakness or  numbness. Psychiatric: Anxiety and depression.  Allergic/Immunilogical: HIV.  ____________________________________________   PHYSICAL EXAM:  VITAL SIGNS: ED Triage Vitals  Enc Vitals Group     BP 08/11/20 1132 (S) (!) 160/108     Pulse Rate 08/11/20 1132 (!) 101     Resp 08/11/20 1132 18     Temp 08/11/20 1132 98 F (36.7 C)     Temp Source 08/11/20 1132 Oral     SpO2 08/11/20 1132 98 %     Weight 08/11/20 1129 100 lb (45.4 kg)     Height 08/11/20 1129 5' 3.5" (1.613 m)     Head Circumference --      Peak Flow --      Pain Score 08/11/20 1129 9     Pain Loc --      Pain Edu? --      Excl. in GC? --    Constitutional: Alert and oriented. Well appearing and in no acute distress. Eyes: Conjunctivae are normal. PERRL. EOMI. Head: Atraumatic. Nose: No congestion/rhinnorhea. Mouth/Throat: Mucous membranes are moist.  Granulomatous oral lesions roof of mouth.  Oropharynx non-erythematous. Hematological/Lymphatic/Immunilogical: No cervical lymphadenopathy. Cardiovascular: Normal rate, regular rhythm. Grossly normal heart sounds.  Good peripheral circulation. Respiratory: Normal respiratory effort.  No retractions. Lungs CTAB. Skin:  Skin is warm, dry and intact. No rash noted. Psychiatric: Mood and affect are normal. Speech and behavior are normal.  ____________________________________________   LABS (all labs ordered are listed, but only abnormal results are displayed)  Labs Reviewed - No data to display ____________________________________________  EKG   ____________________________________________  RADIOLOGY I, Joni Reining, personally viewed and evaluated these images (plain radiographs) as part of my medical decision making, as well as reviewing the written report by the radiologist.  ED MD interpretation:    Official radiology report(s): No results found.  ____________________________________________   PROCEDURES  Procedure(s) performed (including  Critical Care):  Procedures   ____________________________________________   INITIAL IMPRESSION / ASSESSMENT AND PLAN / ED COURSE  As part of my medical decision making, I reviewed the following data within the electronic MEDICAL RECORD NUMBER         Patient presents with mouth pain secondary to her oral lesions with several months.  Patient given prescription for viscous lidocaine and advised to follow-up in ENT clinic by calling for an appointment on Monday morning.      ____________________________________________   FINAL CLINICAL IMPRESSION(S) / ED DIAGNOSES  Final diagnoses:  Granuloma and granuloma-like lesions of oral mucosa     ED Discharge Orders         Ordered    lidocaine (XYLOCAINE) 2 % solution  Every 6 hours PRN        08/11/20 1228          *Please note:  Patricia Luna was evaluated in Emergency Department on 08/11/2020 for the symptoms described in the history of present illness. She was evaluated in the context of the  global COVID-19 pandemic, which necessitated consideration that the patient might be at risk for infection with the SARS-CoV-2 virus that causes COVID-19. Institutional protocols and algorithms that pertain to the evaluation of patients at risk for COVID-19 are in a state of rapid change based on information released by regulatory bodies including the CDC and federal and state organizations. These policies and algorithms were followed during the patient's care in the ED.  Some ED evaluations and interventions may be delayed as a result of limited staffing during and the pandemic.*   Note:  This document was prepared using Dragon voice recognition software and may include unintentional dictation errors.    Joni Reining, PA-C 08/11/20 1233    Dionne Bucy, MD 08/11/20 1515

## 2020-08-11 NOTE — ED Triage Notes (Signed)
Pt arrived via POV with reports of mouth pain x 1 week, pt states its is painful to wear dentures and has pain when swollen.

## 2020-09-04 LAB — SURGICAL PATHOLOGY

## 2020-10-28 ENCOUNTER — Emergency Department
Admission: EM | Admit: 2020-10-28 | Discharge: 2020-10-28 | Disposition: A | Discharge status: Home / Self Care | Payer: Medicare Other | Attending: Emergency Medicine | Admitting: Emergency Medicine

## 2020-10-28 ENCOUNTER — Encounter: Payer: Self-pay | Admitting: Emergency Medicine

## 2020-10-28 ENCOUNTER — Emergency Department: Payer: Medicare Other

## 2020-10-28 DIAGNOSIS — Z7951 Long term (current) use of inhaled steroids: Secondary | ICD-10-CM | POA: Insufficient documentation

## 2020-10-28 DIAGNOSIS — Z20822 Contact with and (suspected) exposure to covid-19: Secondary | ICD-10-CM | POA: Insufficient documentation

## 2020-10-28 DIAGNOSIS — R1084 Generalized abdominal pain: Secondary | ICD-10-CM

## 2020-10-28 DIAGNOSIS — Z79899 Other long term (current) drug therapy: Secondary | ICD-10-CM | POA: Diagnosis not present

## 2020-10-28 DIAGNOSIS — R109 Unspecified abdominal pain: Secondary | ICD-10-CM | POA: Diagnosis present

## 2020-10-28 DIAGNOSIS — J449 Chronic obstructive pulmonary disease, unspecified: Secondary | ICD-10-CM | POA: Insufficient documentation

## 2020-10-28 DIAGNOSIS — F17200 Nicotine dependence, unspecified, uncomplicated: Secondary | ICD-10-CM | POA: Diagnosis not present

## 2020-10-28 DIAGNOSIS — Z21 Asymptomatic human immunodeficiency virus [HIV] infection status: Secondary | ICD-10-CM | POA: Insufficient documentation

## 2020-10-28 DIAGNOSIS — E876 Hypokalemia: Secondary | ICD-10-CM | POA: Diagnosis not present

## 2020-10-28 DIAGNOSIS — K2920 Alcoholic gastritis without bleeding: Secondary | ICD-10-CM | POA: Diagnosis not present

## 2020-10-28 LAB — COMPREHENSIVE METABOLIC PANEL
ALT: 19 U/L (ref 0–44)
AST: 48 U/L — ABNORMAL HIGH (ref 15–41)
Albumin: 3.7 g/dL (ref 3.5–5.0)
Alkaline Phosphatase: 101 U/L (ref 38–126)
Anion gap: 8 (ref 5–15)
BUN: 5 mg/dL — ABNORMAL LOW (ref 8–23)
CO2: 25 mmol/L (ref 22–32)
Calcium: 9.3 mg/dL (ref 8.9–10.3)
Chloride: 102 mmol/L (ref 98–111)
Creatinine, Ser: 0.55 mg/dL (ref 0.44–1.00)
GFR, Estimated: >60 mL/min (ref >60)
Glucose, Bld: 91 mg/dL (ref 70–99)
Potassium: 2.9 mmol/L — ABNORMAL LOW (ref 3.5–5.1)
Sodium: 135 mmol/L (ref 135–145)
Total Bilirubin: 0.4 mg/dL (ref 0.3–1.2)
Total Protein: 9.2 g/dL — ABNORMAL HIGH (ref 6.5–8.1)

## 2020-10-28 LAB — SARS CORONAVIRUS 2 (TAT 6-24 HRS): SARS Coronavirus 2: NEGATIVE

## 2020-10-28 LAB — URINE DRUG SCREEN, QUALITATIVE (ARMC ONLY)
Amphetamines, Ur Screen: NOT DETECTED
Barbiturates, Ur Screen: NOT DETECTED
Benzodiazepine, Ur Scrn: NOT DETECTED
Cannabinoid 50 Ng, Ur ~~LOC~~: NOT DETECTED
Cocaine Metabolite,Ur ~~LOC~~: POSITIVE — AB
MDMA (Ecstasy)Ur Screen: NOT DETECTED
Methadone Scn, Ur: NOT DETECTED
Opiate, Ur Screen: NOT DETECTED
Phencyclidine (PCP) Ur S: NOT DETECTED
Tricyclic, Ur Screen: NOT DETECTED

## 2020-10-28 LAB — URINALYSIS, COMPLETE (UACMP) WITH MICROSCOPIC
Bilirubin Urine: NEGATIVE
Glucose, UA: NEGATIVE mg/dL
Ketones, ur: NEGATIVE mg/dL
Leukocytes,Ua: NEGATIVE
Nitrite: NEGATIVE
Protein, ur: NEGATIVE mg/dL
Specific Gravity, Urine: 1.004 — ABNORMAL LOW (ref 1.005–1.030)
pH: 7 (ref 5.0–8.0)

## 2020-10-28 LAB — CBC
HCT: 35.4 % — ABNORMAL LOW (ref 36.0–46.0)
Hemoglobin: 12.2 g/dL (ref 12.0–15.0)
MCH: 33.7 pg (ref 26.0–34.0)
MCHC: 34.5 g/dL (ref 30.0–36.0)
MCV: 97.8 fL (ref 80.0–100.0)
Platelets: 257 10*3/uL (ref 150–400)
RBC: 3.62 MIL/uL — ABNORMAL LOW (ref 3.87–5.11)
RDW: 15.3 % (ref 11.5–15.5)
WBC: 6.1 10*3/uL (ref 4.0–10.5)
nRBC: 0.3 % — ABNORMAL HIGH (ref 0.0–0.2)

## 2020-10-28 LAB — ETHANOL: Alcohol, Ethyl (B): 66 mg/dL — ABNORMAL HIGH (ref <10)

## 2020-10-28 LAB — TROPONIN I (HIGH SENSITIVITY): Troponin I (High Sensitivity): 4 ng/L (ref <18)

## 2020-10-28 LAB — LIPASE, BLOOD: Lipase: 32 U/L (ref 11–51)

## 2020-10-28 MED ORDER — IOHEXOL 9 MG/ML PO SOLN
1000.0000 mL | Freq: Once | ORAL | Status: AC | PRN
  Administered 2020-10-28: 1000 mL via ORAL

## 2020-10-28 MED ORDER — SODIUM CHLORIDE 0.9 % IV BOLUS
1000.0000 mL | Freq: Once | INTRAVENOUS | Status: AC
  Administered 2020-10-28: 1000 mL via INTRAVENOUS

## 2020-10-28 MED ORDER — IOHEXOL 300 MG/ML  SOLN
100.0000 mL | Freq: Once | INTRAMUSCULAR | Status: AC | PRN
  Administered 2020-10-28: 100 mL via INTRAVENOUS

## 2020-10-28 MED ORDER — LORAZEPAM 2 MG/ML IJ SOLN
1.0000 mg | Freq: Once | INTRAMUSCULAR | Status: AC
  Administered 2020-10-28: 1 mg via INTRAVENOUS
  Filled 2020-10-28: qty 1

## 2020-10-28 MED ORDER — FAMOTIDINE 20 MG PO TABS: 20.0000 mg | ORAL_TABLET | Freq: Two times a day (BID) | ORAL | 0 refills | Status: AC

## 2020-10-28 NOTE — Discharge Instructions (Signed)
Start Pepcid 20 mg twice daily.  Drink alcohol only in moderation.  Do not use illicit drugs.  Return to the ER for worsening symptoms, persistent vomiting, difficulty breathing or other concerns.

## 2020-10-28 NOTE — ED Notes (Signed)
Pt arrives from waiting room via w/c- appears alert and oriented; talking to triage staff pushing w/c

## 2020-10-28 NOTE — ED Notes (Signed)
Late entry -- pt to CT dept via stretcher  

## 2020-10-28 NOTE — ED Provider Notes (Signed)
[facility] Emergency Department Provider Note   ____________________________________________   Event Date/Time   First MD Initiated Contact with Patient 10/28/20 0402     (approximate)  I have reviewed the triage vital signs and the nursing notes.   HISTORY  Chief Complaint Abdominal Pain    HPI Patricia Luna is a 60 y.o. female who presents to the ED from home with a chief complaint of abdominal pain.  Patient with a history of HIV, daily EtOH and crack cocaine use who complains of generalized abdominal pain x2 days.  Taking OTC Pepto-Bismol and Aleve without relief of symptoms.  States her BM today was softer than usual.  Reports only 1 beer in the past 24 hours and recent crack cocaine use last night.  Denies fever, cough, chest pain, shortness of breath, nausea, vomiting or dysuria.     Past Medical History:  Diagnosis Date  . Anxiety   . COPD (chronic obstructive pulmonary disease) (HCC)   . Depression   . Hearing loss   . HIV (human immunodeficiency virus infection) [facility])     Patient Active Problem List   Diagnosis Date Noted  . Sepsis (HCC) 06/10/2018  . Rectal pain 04/13/2018    Past Surgical History:  Procedure Laterality Date  . CESAREAN SECTION    . COLONOSCOPY  2018    Prior to Admission medications   Medication Sig Start Date End Date Taking? Authorizing Provider  famotidine (PEPCID) 20 MG tablet Take 1 tablet (20 mg total) by mouth 2 (two) times daily. 10/28/20  Yes Irean Hong, MD  albuterol (PROVENTIL HFA;VENTOLIN HFA) 108 (90 Base) MCG/ACT inhaler Inhale 2 puffs into the lungs every 6 (six) hours as needed for wheezing or shortness of breath. 08/03/16   Willy Eddy, MD  aluminum-magnesium hydroxide-simethicone (MAALOX) 200-200-20 MG/5ML SUSP Take 30 mLs by mouth 4 (four) times daily -  before meals and at bedtime. 03/01/19   Sharman Cheek, MD  amoxicillin-clavulanate (AUGMENTIN) 875-125 MG tablet Take 1 tablet by  mouth every 12 (twelve) hours. 06/12/18   Alford Highland, MD  azithromycin (ZITHROMAX) 250 MG tablet One tab po daily for three days 06/11/18   Alford Highland, MD  citalopram (CELEXA) 10 MG tablet Take 1 tablet by mouth daily. 10/20/17   [provider]  DESCOVY 200-25 MG tablet Take 1 tablet by mouth daily.  04/01/18   [provider]  feeding supplement, ENSURE ENLIVE, (ENSURE ENLIVE) LIQD Take 237 mLs by mouth 2 (two) times daily between meals. 06/11/18   Alford Highland, MD  fluticasone (FLOVENT HFA) 220 MCG/ACT inhaler Inhale 2 puffs into the lungs 2 (two) times daily. Rinse out mouth 06/11/18   Alford Highland, MD  lidocaine (XYLOCAINE) 2 % solution Use as directed 5 mLs in the mouth or throat every 6 (six) hours as needed for mouth pain. 08/11/20   Joni Reining, PA-C  metoCLOPramide (REGLAN) 10 MG tablet Take 1 tablet (10 mg total) by mouth every 6 (six) hours as needed. 03/01/19   Sharman Cheek, MD  Multiple Vitamin (MULTIVITAMIN WITH MINERALS) TABS tablet Take 1 tablet by mouth daily. 06/12/18   Alford Highland, MD  predniSONE (DELTASONE) 10 MG tablet 3 tabs po day 1, 2 tabs po day2; 1 tab po day 3 06/12/18   Wieting, Richard, MD  TIVICAY 50 MG tablet Take 50 mg by mouth daily.  04/01/18   [provider]    Allergies Mushroom extract complex and Other  History reviewed. No pertinent  family history.  Social History Social History   Tobacco Use  . Smoking status: Current Every Day Smoker    Last attempt to quit: 08/18/2016    Years since quitting: 4.1  . Smokeless tobacco: Never Used  Substance Use Topics  . Alcohol use: Yes  . Drug use: No    Review of Systems  Constitutional: No fever/chills Eyes: No visual changes. ENT: No sore throat. Cardiovascular: Denies chest pain. Respiratory: Denies shortness of breath. Gastrointestinal: Positive for abdominal pain.  No nausea, no vomiting.  No diarrhea.  No constipation. Genitourinary: Negative  for dysuria. Musculoskeletal: Negative for back pain. Skin: Negative for rash. Neurological: Negative for headaches, focal weakness or numbness.   ____________________________________________   PHYSICAL EXAM:  VITAL SIGNS: ED Triage Vitals  Enc Vitals Group     BP 10/28/20 0307 (!) 171/115     Pulse Rate 10/28/20 0306 86     Resp 10/28/20 0306 18     Temp 10/28/20 0306 98.6 F (37 C)     Temp Source 10/28/20 0306 Oral     SpO2 10/28/20 0306 100 %     Weight --      Height --      Head Circumference --      Peak Flow --      Pain Score --      Pain Loc --      Pain Edu? --      Excl. in GC? --     Constitutional: Alert and oriented. Well appearing and in no acute distress. Eyes: Conjunctivae are normal. PERRL. EOMI. Head: Atraumatic. Nose: No congestion/rhinnorhea. Mouth/Throat: Mucous membranes are moist.   Neck: No stridor.   Cardiovascular: Normal rate, regular rhythm. Grossly normal heart sounds.  Good peripheral circulation. Respiratory: Normal respiratory effort.  No retractions. Lungs CTAB. Gastrointestinal: Soft and mildly diffusely tender to palpation without rebound or guarding. No distention. No abdominal bruits. No CVA tenderness. Musculoskeletal: No lower extremity tenderness nor edema.  No joint effusions. Neurologic:  Normal speech and language. No gross focal neurologic deficits are appreciated. No gait instability. Skin:  Skin is warm, dry and intact. No rash noted. Psychiatric: Mood and affect are normal. Speech and behavior are normal.  ____________________________________________   LABS (all labs ordered are listed, but only abnormal results are displayed)  Labs Reviewed  COMPREHENSIVE METABOLIC PANEL - Abnormal; Notable for the following components:      Result Value   Potassium 2.9 (*)    BUN 5 (*)    Total Protein 9.2 (*)    AST 48 (*)    All other components within normal limits  CBC - Abnormal; Notable for the following components:    RBC 3.62 (*)    HCT 35.4 (*)    nRBC 0.3 (*)    All other components within normal limits  URINALYSIS, COMPLETE (UACMP) WITH MICROSCOPIC - Abnormal; Notable for the following components:   Color, Urine STRAW (*)    APPearance CLEAR (*)    Specific Gravity, Urine 1.004 (*)    Hgb urine dipstick SMALL (*)    Bacteria, UA RARE (*)    All other components within normal limits  ETHANOL - Abnormal; Notable for the following components:   Alcohol, Ethyl (B) 66 (*)    All other components within normal limits  URINE DRUG SCREEN, QUALITATIVE ([facility] ONLY) - Abnormal; Notable for the following components:   Cocaine Metabolite,Ur [place] POSITIVE (*)    All other components within normal limits  SARS CORONAVIRUS 2 (TAT 6-24 HRS)  LIPASE, BLOOD  TROPONIN I (HIGH SENSITIVITY)   ____________________________________________  EKG  ED ECG REPORT I, Cleota Pellerito J, the attending physician, personally viewed and interpreted this ECG.   Date: 10/28/2020  EKG Time: 0543  Rate: 83  Rhythm: normal EKG, normal sinus rhythm  Axis: Normal  Intervals:none  ST&T Change: Nonspecific  ____________________________________________  RADIOLOGY I, Jnaya Butrick J, personally viewed and evaluated these images (plain radiographs) as part of my medical decision making, as well as reviewing the written report by the radiologist.  ED MD interpretation: Gastritis, probable COVID-19 pneumonia  Official radiology report(s): CT Abdomen Pelvis W Contrast  Result Date: 10/28/2020 CLINICAL DATA:  Diffuse abdominal pain for 2 days. EXAM: CT ABDOMEN AND PELVIS WITH CONTRAST TECHNIQUE: Multidetector CT imaging of the abdomen and pelvis was performed using the standard protocol following bolus administration of intravenous contrast. CONTRAST:  OMNIPAQUE IOHEXOL 300 MG/ML  SOLN COMPARISON:  CT of the abdomen and pelvis 03/01/2019 FINDINGS: Lower chest: Patchy peripheral airspace opacities are present in the lower lobes bilaterally  as well as the lingula. The heart size is normal. No significant pleural or pericardial effusion is present. Hepatobiliary: Mild diffuse fatty infiltration liver is again noted. This is most pronounced along the falciform ligament. No other discrete lesions are present. The common bile duct and gallbladder are within normal limits. Pancreas: Mild dilation of the pancreatic duct noted. No obstructing mass lesion or stone evident. Pancreas otherwise unremarkable. Spleen: Normal in size without focal abnormality. Adrenals/Urinary Tract: Adrenal glands are within normal limits bilaterally. No stone or mass lesion is present. No obstruction is present. The ureters are within normal limits. Urinary bladder is normal. Stomach/Bowel: Marked circumferential wall thickening is present in the distal stomach. More moderate wall thickening is present about the gastric fundus. No discrete lesion is present. Duodenum is within normal limits. Small bowel is unremarkable. Terminal ileum is within normal limits. Appendix is visualized and normal. The ascending and transverse colon is within normal limits. Descending and sigmoid colon is normal. Vascular/Lymphatic: Atherosclerotic changes are present in the aorta a branch vessels without aneurysm. Reproductive: Uterus and bilateral adnexa are unremarkable. Other: No abdominal wall hernia or abnormality. No abdominopelvic ascites. Musculoskeletal: Vertebral body heights and alignment are normal. No focal lytic or blastic lesions are present. Bony pelvis is normal. Hips are located and within normal limits. IMPRESSION: 1. Marked circumferential wall thickening in the distal stomach compatible with acute gastritis. No discrete lesion is present. Infiltrative disease considered less likely. Recommend GI consultation for possible endoscopy. 2. No other acute or focal lesion to explain the patient's abdominal pain. 3. Hepatic steatosis. 4. Patchy peripheral airspace opacities are present in  the lower lobes bilaterally as well as the lingula. Findings are concerning for viral pneumonia. 5. Mild dilation of the pancreatic duct is chronic. No obstructing lesion present. 6. Aortic Atherosclerosis (ICD10-I70.0). Electronically Signed   By: Marin Roberts M.D.   On: 10/28/2020 06:46    ____________________________________________   PROCEDURES  Procedure(s) performed (including Critical Care):  Procedures   ____________________________________________   INITIAL IMPRESSION / ASSESSMENT AND PLAN / ED COURSE  As part of my medical decision making, I reviewed the following data within the electronic MEDICAL RECORD NUMBER Nursing notes reviewed and incorporated, Labs reviewed, EKG interpreted, Old chart reviewed, Radiograph reviewed and Notes from prior ED visits     60 year old female presenting with abdominal pain in the setting of EtOH and crack cocaine use. Differential  diagnosis includes, but is not limited to, ovarian cyst, ovarian torsion, acute appendicitis, diverticulitis, urinary tract infection/pyelonephritis, endometriosis, bowel obstruction, colitis, renal colic, gastroenteritis, hernia, fibroids, endometriosis, etc.  Laboratory results remarkable for mild hypokalemia.  Will initiate IV fluid resuscitation, IV Ativan and proceed with CT abdomen/pelvis to evaluate for intra-abdominal etiology of patient's symptoms.  Clinical Course as of 10/28/20 6834  Wynelle Link Oct 28, 2020  1964 Updated patient and family member of all test and imaging results.  Will discharge home with prescription for Pepcid.  Will obtain COVID-19 swab prior to discharge.  Strict return precautions given.  Both verbalized understanding and agree with plan of care. [JS]    Clinical Course User Index [JS] Irean Hong, MD     ____________________________________________   FINAL CLINICAL IMPRESSION(S) / ED DIAGNOSES  Final diagnoses:  Generalized abdominal pain  Acute alcoholic gastritis without  hemorrhage  Hypokalemia     ED Discharge Orders         Ordered    famotidine (PEPCID) 20 MG tablet  2 times daily        10/28/20 2297          *Please note:  Patricia Luna was evaluated in Emergency Department on 10/28/2020 for the symptoms described in the history of present illness. She was evaluated in the context of the global COVID-19 pandemic, which necessitated consideration that the patient might be at risk for infection with the SARS-CoV-2 virus that causes COVID-19. Institutional protocols and algorithms that pertain to the evaluation of patients at risk for COVID-19 are in a state of rapid change based on information released by regulatory bodies including the CDC and federal and state organizations. These policies and algorithms were followed during the patient's care in the ED.  Some ED evaluations and interventions may be delayed as a result of limited staffing during and the pandemic.*   Note:  This document was prepared using Dragon voice recognition software and may include unintentional dictation errors.   Irean Hong, MD 10/28/20 [phone]

## 2020-10-28 NOTE — ED Notes (Signed)
Pt has begun to drink 1 o 2 500cc bottles of oral contrast - well tolerating at this time

## 2020-10-28 NOTE — ED Triage Notes (Addendum)
Pt c/o "all over" abdominal pain x2 days without relief of OTC medications like pepto and aleve. Pt reports her BM have not been normal and she has been taking stool softeners without relief. Pt with hx/o daily ETOH but reports 1 beer in last 24 hours and reports she smoked crack last night.

## 2020-10-28 NOTE — ED Notes (Signed)
Pt has completed first bottle of oral contrast and has already started second bottle - late entry

## 2020-11-17 ENCOUNTER — Emergency Department
Admission: EM | Admit: 2020-11-17 | Discharge: 2020-11-17 | Disposition: A | Discharge status: Home / Self Care | Payer: Medicare Other | Attending: Emergency Medicine | Admitting: Emergency Medicine

## 2020-11-17 ENCOUNTER — Other Ambulatory Visit: Payer: Self-pay

## 2020-11-17 ENCOUNTER — Emergency Department: Payer: Medicare Other

## 2020-11-17 DIAGNOSIS — J449 Chronic obstructive pulmonary disease, unspecified: Secondary | ICD-10-CM | POA: Insufficient documentation

## 2020-11-17 DIAGNOSIS — F17200 Nicotine dependence, unspecified, uncomplicated: Secondary | ICD-10-CM | POA: Diagnosis not present

## 2020-11-17 DIAGNOSIS — J029 Acute pharyngitis, unspecified: Secondary | ICD-10-CM | POA: Diagnosis not present

## 2020-11-17 DIAGNOSIS — Z7951 Long term (current) use of inhaled steroids: Secondary | ICD-10-CM | POA: Diagnosis not present

## 2020-11-17 DIAGNOSIS — J189 Pneumonia, unspecified organism: Secondary | ICD-10-CM | POA: Diagnosis not present

## 2020-11-17 DIAGNOSIS — Z21 Asymptomatic human immunodeficiency virus [HIV] infection status: Secondary | ICD-10-CM | POA: Diagnosis not present

## 2020-11-17 DIAGNOSIS — Z20822 Contact with and (suspected) exposure to covid-19: Secondary | ICD-10-CM | POA: Insufficient documentation

## 2020-11-17 LAB — CBC
HCT: 39.2 % (ref 36.0–46.0)
Hemoglobin: 13.3 g/dL (ref 12.0–15.0)
MCH: 32.9 pg (ref 26.0–34.0)
MCHC: 33.9 g/dL (ref 30.0–36.0)
MCV: 97 fL (ref 80.0–100.0)
Platelets: 255 10*3/uL (ref 150–400)
RBC: 4.04 MIL/uL (ref 3.87–5.11)
RDW: 14.7 % (ref 11.5–15.5)
WBC: 9.7 10*3/uL (ref 4.0–10.5)
nRBC: 0.3 % — ABNORMAL HIGH (ref 0.0–0.2)

## 2020-11-17 LAB — BASIC METABOLIC PANEL
Anion gap: 12 (ref 5–15)
BUN: 6 mg/dL — ABNORMAL LOW (ref 8–23)
CO2: 26 mmol/L (ref 22–32)
Calcium: 8.8 mg/dL — ABNORMAL LOW (ref 8.9–10.3)
Chloride: 96 mmol/L — ABNORMAL LOW (ref 98–111)
Creatinine, Ser: 0.89 mg/dL (ref 0.44–1.00)
GFR, Estimated: >60 mL/min (ref >60)
Glucose, Bld: 109 mg/dL — ABNORMAL HIGH (ref 70–99)
Potassium: 3 mmol/L — ABNORMAL LOW (ref 3.5–5.1)
Sodium: 134 mmol/L — ABNORMAL LOW (ref 135–145)

## 2020-11-17 LAB — TROPONIN I (HIGH SENSITIVITY)
Troponin I (High Sensitivity): 6 ng/L (ref <18)
Troponin I (High Sensitivity): 7 ng/L (ref <18)

## 2020-11-17 LAB — RESP PANEL BY RT-PCR (FLU A&B, COVID) ARPGX2
Influenza A by PCR: NEGATIVE
Influenza B by PCR: NEGATIVE
SARS Coronavirus 2 by RT PCR: NEGATIVE

## 2020-11-17 LAB — PROCALCITONIN: Procalcitonin: <0.1 ng/mL

## 2020-11-17 LAB — POC SARS CORONAVIRUS 2 AG -  ED: SARS Coronavirus 2 Ag: NEGATIVE

## 2020-11-17 MED ORDER — POTASSIUM CHLORIDE 20 MEQ PO PACK
40.0000 meq | PACK | Freq: Two times a day (BID) | ORAL | Status: DC
  Administered 2020-11-17: 40 meq via ORAL
  Filled 2020-11-17: qty 2

## 2020-11-17 MED ORDER — AZITHROMYCIN 500 MG PO TABS
500.0000 mg | ORAL_TABLET | Freq: Once | ORAL | Status: AC
  Administered 2020-11-17: 500 mg via ORAL
  Filled 2020-11-17: qty 1

## 2020-11-17 MED ORDER — AZITHROMYCIN 250 MG PO TABS: 250.0000 mg | ORAL_TABLET | Freq: Every day | ORAL | 0 refills | Status: AC

## 2020-11-17 MED ORDER — SODIUM CHLORIDE 0.9 % IV SOLN: 500.0000 mg | Freq: Once | INTRAVENOUS | Status: DC

## 2020-11-17 MED ORDER — KETOROLAC TROMETHAMINE 30 MG/ML IJ SOLN
30.0000 mg | Freq: Once | INTRAMUSCULAR | Status: AC
  Administered 2020-11-17: 30 mg via INTRAVENOUS
  Filled 2020-11-17: qty 1

## 2020-11-17 MED ORDER — ACETAMINOPHEN 500 MG PO TABS
1000.0000 mg | ORAL_TABLET | Freq: Once | ORAL | Status: AC
  Administered 2020-11-17: 1000 mg via ORAL
  Filled 2020-11-17: qty 2

## 2020-11-17 MED ORDER — SODIUM CHLORIDE 0.9 % IV BOLUS
1000.0000 mL | Freq: Once | INTRAVENOUS | Status: AC
  Administered 2020-11-17: 1000 mL via INTRAVENOUS

## 2020-11-17 NOTE — ED Provider Notes (Signed)
[facility] Emergency Department Provider Note  ____________________________________________   I have reviewed the triage vital signs and the nursing notes.   HISTORY  Chief Complaint Sore Throat   History limited by: Not Limited   HPI Patricia Luna is a 60 y.o. female who presents to the emergency department today because of concerns for sore throat and body aches.  Patient states that she had her Covid booster 2 days ago.  Starting yesterday she noticed body aches.  Discontinued today.  It is also now been accompanied by some sore throat.  She states it is worse when she tries to swallow.  She denies any significant shortness of breath.  She has had a slight cough.  She has had fevers.  Records reviewed. Per medical record review patient has a history of COPD, HIV  Past Medical History:  Diagnosis Date  . Anxiety   . COPD (chronic obstructive pulmonary disease) (HCC)   . Depression   . Hearing loss   . HIV (human immunodeficiency virus infection) [facility])     Patient Active Problem List   Diagnosis Date Noted  . Sepsis (HCC) 06/10/2018  . Rectal pain 04/13/2018    Past Surgical History:  Procedure Laterality Date  . CESAREAN SECTION    . COLONOSCOPY  2018    Prior to Admission medications   Medication Sig Start Date End Date Taking? Authorizing Provider  albuterol (PROVENTIL HFA;VENTOLIN HFA) 108 (90 Base) MCG/ACT inhaler Inhale 2 puffs into the lungs every 6 (six) hours as needed for wheezing or shortness of breath. 08/03/16   Willy Eddy, MD  aluminum-magnesium hydroxide-simethicone (MAALOX) 200-200-20 MG/5ML SUSP Take 30 mLs by mouth 4 (four) times daily -  before meals and at bedtime. 03/01/19   Sharman Cheek, MD  amoxicillin-clavulanate (AUGMENTIN) 875-125 MG tablet Take 1 tablet by mouth every 12 (twelve) hours. 06/12/18   Alford Highland, MD  azithromycin (ZITHROMAX) 250 MG tablet One tab po daily for three days 06/11/18   Alford Highland, MD  citalopram (CELEXA) 10 MG tablet Take 1 tablet by mouth daily. 10/20/17   [provider]  DESCOVY 200-25 MG tablet Take 1 tablet by mouth daily.  04/01/18   [provider]  famotidine (PEPCID) 20 MG tablet Take 1 tablet (20 mg total) by mouth 2 (two) times daily. 10/28/20   Irean Hong, MD  feeding supplement, ENSURE ENLIVE, (ENSURE ENLIVE) LIQD Take 237 mLs by mouth 2 (two) times daily between meals. 06/11/18   Alford Highland, MD  fluticasone (FLOVENT HFA) 220 MCG/ACT inhaler Inhale 2 puffs into the lungs 2 (two) times daily. Rinse out mouth 06/11/18   Alford Highland, MD  lidocaine (XYLOCAINE) 2 % solution Use as directed 5 mLs in the mouth or throat every 6 (six) hours as needed for mouth pain. 08/11/20   Joni Reining, PA-C  metoCLOPramide (REGLAN) 10 MG tablet Take 1 tablet (10 mg total) by mouth every 6 (six) hours as needed. 03/01/19   Sharman Cheek, MD  Multiple Vitamin (MULTIVITAMIN WITH MINERALS) TABS tablet Take 1 tablet by mouth daily. 06/12/18   Alford Highland, MD  predniSONE (DELTASONE) 10 MG tablet 3 tabs po day 1, 2 tabs po day2; 1 tab po day 3 06/12/18   Wieting, Richard, MD  TIVICAY 50 MG tablet Take 50 mg by mouth daily.  04/01/18   [provider]    Allergies Mushroom extract complex and Other  No family history on file.  Social History Social History  Tobacco Use  . Smoking status: Current Every Day Smoker    Last attempt to quit: 08/18/2016    Years since quitting: 4.2  . Smokeless tobacco: Never Used  Substance Use Topics  . Alcohol use: Yes  . Drug use: No    Review of Systems Constitutional: Positive for fevers.  Eyes: No visual changes. ENT: Positive for sore throat.  Cardiovascular: Denies chest pain. Respiratory: Positive for cough. Gastrointestinal: No abdominal pain.  No nausea, no vomiting.  No diarrhea.   Genitourinary: Negative for dysuria. Musculoskeletal: Positive for body aches. Skin: Negative for  rash. Neurological: Negative for headaches, focal weakness or numbness.  ____________________________________________   PHYSICAL EXAM:  VITAL SIGNS: ED Triage Vitals  Enc Vitals Group     BP 11/17/20 1724 (!) 139/92     Pulse Rate 11/17/20 1724 (!) 126     Resp 11/17/20 1730 20     Temp 11/17/20 1724 (!) 101.8 F (38.8 C)     Temp Source 11/17/20 1724 Oral     SpO2 11/17/20 1724 100 %     Weight 11/17/20 1731 115 lb (52.2 kg)     Height 11/17/20 1731 5\' 3"  (1.6 m)     Head Circumference --      Peak Flow --      Pain Score 11/17/20 1730 10   Constitutional: Alert and oriented.  Eyes: Conjunctivae are normal.  ENT      Head: Normocephalic and atraumatic.      Nose: No congestion/rhinnorhea.      Mouth/Throat: Mucous membranes are moist.      Neck: No stridor. Hematological/Lymphatic/Immunilogical: No cervical lymphadenopathy. Cardiovascular: Tachycardic, regular rhythm.  No murmurs, rubs, or gallops.  Respiratory: Normal respiratory effort without tachypnea nor retractions. Breath sounds are clear and equal bilaterally. No wheezes/rales/rhonchi. Gastrointestinal: Soft and non tender. No rebound. No guarding.  Genitourinary: Deferred Musculoskeletal: Normal range of motion in all extremities. No lower extremity edema. Neurologic:  Normal speech and language. No gross focal neurologic deficits are appreciated.  Skin:  Skin is warm, dry and intact. No rash noted. Psychiatric: Mood and affect are normal. Speech and behavior are normal. Patient exhibits appropriate insight and judgment.  ____________________________________________    LABS (pertinent positives/negatives)  Trop hs 7 CBC wbc 9.7, hgb 13.3, plt 255 BMP na 134, k 3.0, glu 109, cr 0.89 COVID negative ____________________________________________   EKG  I, 11/19/20, attending physician, personally viewed and interpreted this EKG  EKG Time: 1728 Rate: 124 Rhythm: sinus tachycardia Axis:  normal Intervals: qtc 416 QRS: LVH ST changes: no st elevation Impression: abnormal ekg  ____________________________________________    RADIOLOGY  CXR Patchy airspace opacity  ____________________________________________   PROCEDURES  Procedures  ____________________________________________   INITIAL IMPRESSION / ASSESSMENT AND PLAN / ED COURSE  Pertinent labs & imaging results that were available during my care of the patient were reviewed by me and considered in my medical decision making (see chart for details).   Patient presented to the emergency department today because of concern for sore throat and body aches that started a day after getting her third covid shot. CXR performed today did show diffuse airspace disease. Blood work without leukocytosis. The patient was negative for covid. Procalcitonin <.10. At this time do think likely viral illness given sore throat. While I do have low suspicion for bacterial infection will start patient on antibiotics. Patient states she does take her antiretrovirals every day.    ____________________________________________   FINAL CLINICAL IMPRESSION(S) /  ED DIAGNOSES  Final diagnoses:  Pharyngitis, unspecified etiology  Pneumonia due to infectious organism, unspecified laterality, unspecified part of lung     Note: This dictation was prepared with Dragon dictation. Any transcriptional errors that result from this process are unintentional     Phineas Semen, MD 11/17/20 [id number]

## 2020-11-17 NOTE — ED Triage Notes (Addendum)
Pt via EMS from home. Pt states she got her 3rd COVID vaccine on Thursday. Pt started having a sore throat yesterday morning and says it is hard for her swallow but denies difficulty breathing. Pt also c/o R-sided CP and R arm pain that also started yesterday. Pt is A&Ox4 and NAD.

## 2020-11-17 NOTE — Discharge Instructions (Addendum)
Please seek medical attention for any high fevers, chest pain, shortness of breath, change in behavior, persistent vomiting, bloody stool or any other new or concerning symptoms.  

## 2020-11-17 NOTE — ED Triage Notes (Signed)
First RN Note: pt to ED via [facility] with c/o sore throat, shoulder pain, back pain and generalized body aches. Per EMS pt had 3rd covid shot on Thursday and woke up this morning with generalized body aches and sore throat this morning.    101.3 120 HR 162/100 (No BP meds today)

## 2021-01-02 ENCOUNTER — Ambulatory Visit: Payer: Medicare Other | Admitting: Gastroenterology

## 2021-01-02 ENCOUNTER — Encounter: Payer: Self-pay | Admitting: *Deleted

## 2021-04-30 ENCOUNTER — Other Ambulatory Visit: Payer: Self-pay

## 2021-04-30 ENCOUNTER — Emergency Department: Payer: Medicare Other

## 2021-04-30 ENCOUNTER — Emergency Department
Admission: EM | Admit: 2021-04-30 | Discharge: 2021-04-30 | Disposition: A | Discharge status: Home / Self Care | Payer: Medicare Other | Attending: Emergency Medicine | Admitting: Emergency Medicine

## 2021-04-30 DIAGNOSIS — F17210 Nicotine dependence, cigarettes, uncomplicated: Secondary | ICD-10-CM | POA: Diagnosis not present

## 2021-04-30 DIAGNOSIS — Z21 Asymptomatic human immunodeficiency virus [HIV] infection status: Secondary | ICD-10-CM | POA: Diagnosis not present

## 2021-04-30 DIAGNOSIS — S3992XA Unspecified injury of lower back, initial encounter: Secondary | ICD-10-CM | POA: Diagnosis not present

## 2021-04-30 DIAGNOSIS — J449 Chronic obstructive pulmonary disease, unspecified: Secondary | ICD-10-CM | POA: Insufficient documentation

## 2021-04-30 DIAGNOSIS — W11XXXA Fall on and from ladder, initial encounter: Secondary | ICD-10-CM | POA: Diagnosis not present

## 2021-04-30 DIAGNOSIS — Z7951 Long term (current) use of inhaled steroids: Secondary | ICD-10-CM | POA: Diagnosis not present

## 2021-04-30 MED ORDER — CYCLOBENZAPRINE HCL 5 MG PO TABS: 5.0000 mg | ORAL_TABLET | Freq: Three times a day (TID) | ORAL | 0 refills | Status: AC | PRN

## 2021-04-30 MED ORDER — IBUPROFEN 800 MG PO TABS: 800.0000 mg | ORAL_TABLET | Freq: Three times a day (TID) | ORAL | 0 refills | Status: AC | PRN

## 2021-04-30 MED ORDER — LIDOCAINE 5 % EX PTCH
1.0000 | MEDICATED_PATCH | Freq: Once | CUTANEOUS | Status: DC
  Administered 2021-04-30: 1 via TRANSDERMAL
  Filled 2021-04-30: qty 1

## 2021-04-30 NOTE — ED Notes (Signed)
See triage note  Presents with pain to lower back  States she hither lower back on a chair about 1 week ago  Ambulates slowly d/t pain

## 2021-04-30 NOTE — ED Triage Notes (Signed)
Pt to ED for lower back pain x1 week after hitting it on chair. Has taken tylenol and ibuprofen Ambulatory

## 2021-04-30 NOTE — ED Provider Notes (Signed)
[facility] Emergency Department Provider Note ____________________________________________  Time seen: 0919  I have reviewed the triage vital signs and the nursing notes.  HISTORY  Chief Complaint  Back Pain   HPI Patricia Luna is a 60 y.o. female who presents with of the ED for evaluation of tailbone and coccyx pain.  Patient describes mechanical fall about a week ago, when she stepped back off of a ladder, landing on her buttocks.  She denies any head injury or LOC.  She denies any bladder or bowel incontinence, foot drop, or saddle anesthesia.  Since that time she has had pain to the tailbone at the gluteal cleft.  Past Medical History:  Diagnosis Date   Anxiety    COPD (chronic obstructive pulmonary disease) (HCC)    Depression    Hearing loss    HIV (human immunodeficiency virus infection) (HCC)     Patient Active Problem List   Diagnosis Date Noted   Sepsis (HCC) 06/10/2018   Rectal pain 04/13/2018    Past Surgical History:  Procedure Laterality Date   CESAREAN SECTION     COLONOSCOPY  2018    Prior to Admission medications   Medication Sig Start Date End Date Taking? Authorizing Provider  cyclobenzaprine (FLEXERIL) 5 MG tablet Take 1 tablet (5 mg total) by mouth 3 (three) times daily as needed. 04/30/21  Yes Korion Cuevas, Charlesetta Ivory, PA-C  ibuprofen (ADVIL) 800 MG tablet Take 1 tablet (800 mg total) by mouth every 8 (eight) hours as needed. 04/30/21  Yes Koltin Wehmeyer, Charlesetta Ivory, PA-C  albuterol (PROVENTIL HFA;VENTOLIN HFA) 108 (90 Base) MCG/ACT inhaler Inhale 2 puffs into the lungs every 6 (six) hours as needed for wheezing or shortness of breath. 08/03/16   Willy Eddy, MD  aluminum-magnesium hydroxide-simethicone (MAALOX) 200-200-20 MG/5ML SUSP Take 30 mLs by mouth 4 (four) times daily -  before meals and at bedtime. 03/01/19   Sharman Cheek, MD  citalopram (CELEXA) 10 MG tablet Take 1 tablet by mouth daily. 10/20/17   [provider]  DESCOVY 200-25 MG tablet Take 1 tablet by mouth daily.  04/01/18   [provider]  famotidine (PEPCID) 20 MG tablet Take 1 tablet (20 mg total) by mouth 2 (two) times daily. 10/28/20   Irean Hong, MD  feeding supplement, ENSURE ENLIVE, (ENSURE ENLIVE) LIQD Take 237 mLs by mouth 2 (two) times daily between meals. 06/11/18   Alford Highland, MD  fluticasone (FLOVENT HFA) 220 MCG/ACT inhaler Inhale 2 puffs into the lungs 2 (two) times daily. Rinse out mouth 06/11/18   Alford Highland, MD  metoCLOPramide (REGLAN) 10 MG tablet Take 1 tablet (10 mg total) by mouth every 6 (six) hours as needed. 03/01/19   Sharman Cheek, MD  Multiple Vitamin (MULTIVITAMIN WITH MINERALS) TABS tablet Take 1 tablet by mouth daily. 06/12/18   Wieting, Richard, MD  TIVICAY 50 MG tablet Take 50 mg by mouth daily.  04/01/18   [provider]    Allergies Mushroom extract complex and Other  History reviewed. No pertinent family history.  Social History Social History   Tobacco Use   Smoking status: Every Day    Types: Cigarettes    Last attempt to quit: 08/18/2016    Years since quitting: 4.7   Smokeless tobacco: Never  Substance Use Topics   Alcohol use: Yes   Drug use: No    Review of Systems  Constitutional: Negative for fever. Eyes: Negative for visual changes. ENT: Negative for sore throat. Cardiovascular:  Negative for chest pain. Respiratory: Negative for shortness of breath. Gastrointestinal: Negative for abdominal pain, vomiting and diarrhea. Genitourinary: Negative for dysuria. Musculoskeletal: Negative for back pain.  Tailbone pain as above. Skin: Negative for rash. Neurological: Negative for headaches, focal weakness or numbness. ____________________________________________  PHYSICAL EXAM:  VITAL SIGNS: ED Triage Vitals [04/30/21 0855]  Enc Vitals Group     BP (!) 144/102     Pulse Rate 80     Resp 16     Temp 98.4 F (36.9 C)     Temp Source Oral      SpO2 96 %     Weight 110 lb (49.9 kg)     Height 5\' 3"  (1.6 m)     Head Circumference      Peak Flow      Pain Score 10     Pain Loc      Pain Edu?      Excl. in GC?     Constitutional: Alert and oriented. Well appearing and in no distress. Head: Normocephalic and atraumatic. Eyes: Conjunctivae are normal. Normal extraocular movements Cardiovascular: Normal rate, regular rhythm. Normal distal pulses. Respiratory: Normal respiratory effort. No wheezes/rales/rhonchi. Gastrointestinal: Soft and nontender. No distention. Musculoskeletal: Normal spinal alignment without midline tenderness, spasm, deformity, or step-off.  Patient is tender to palpation to the right side of the gluteal cleft at the tailbone.  Nontender with normal range of motion in all extremities.  Neurologic: Cranial nerves II to XII grossly intact.  Normal gait without ataxia. Normal speech and language. No gross focal neurologic deficits are appreciated. Skin:  Skin is warm, dry and intact. No rash noted. Psychiatric: Mood and affect are normal. Patient exhibits appropriate insight and judgment. ____________________________________________    {LABS (pertinent positives/negatives)  ____________________________________________  {EKG  ____________________________________________   RADIOLOGY Official radiology report(s): DG Sacrum/Coccyx  Result Date: 04/30/2021 CLINICAL DATA:  Fall EXAM: SACRUM AND COCCYX - 2+ VIEW COMPARISON:  CT 10/28/2020 FINDINGS: There is no evidence of acute fracture. Unchanged angulation at the sacrococcygeal junction. Minimal symmetric sacroiliac joint degenerative change. IMPRESSION: No radiographically evident acute fracture involving the sacrum or coccyx. Electronically Signed   By: 12/26/2020   On: 04/30/2021 10:13   ____________________________________________  PROCEDURES  Lidoderm patch 5 %  Procedures ____________________________________________   INITIAL IMPRESSION /  ASSESSMENT AND PLAN / ED COURSE  As part of my medical decision making, I reviewed the following data within the electronic MEDICAL RECORD NUMBER Radiograph reviewed WNL4 and Notes from prior ED visits    DDX: coccyx  fracture, sacral contusion, lumbar strain    Patient ED evaluation of injury sustained following mechanical fall.  He was evaluated for complaints, found to have a negative x-ray of the sacrum and coccyx.  She was treated empirically for pain With Lidoderm patches as well as ibuprofen and cyclobenzaprine.  She will follow-up with primary provider or return to the ED if needed.  Patricia Luna was evaluated in Emergency Department on 04/30/2021 for the symptoms described in the history of present illness. She was evaluated in the context of the global COVID-19 pandemic, which necessitated consideration that the patient might be at risk for infection with the SARS-CoV-2 virus that causes COVID-19. Institutional protocols and algorithms that pertain to the evaluation of patients at risk for COVID-19 are in a state of rapid change based on information released by regulatory bodies including the CDC and federal and state organizations. These policies and algorithms were followed during the patient's care in  the ED. ____________________________________________  FINAL CLINICAL IMPRESSION(S) / ED DIAGNOSES  Final diagnoses:  Tailbone injury, initial encounter      Lissa Hoard, PA-C 04/30/21 1721    Shaune Pollack, MD 05/01/21 1914

## 2021-04-30 NOTE — Discharge Instructions (Addendum)
No evidence of fracture. Take the prescription meds as directed. Apply ice and/or moist heat to reduce pain. Follow-up with your provider for continued symptoms.

## 2021-09-09 ENCOUNTER — Other Ambulatory Visit: Payer: Self-pay

## 2021-09-09 ENCOUNTER — Emergency Department
Admission: EM | Admit: 2021-09-09 | Discharge: 2021-09-09 | Disposition: A | Discharge status: Home / Self Care | Payer: Medicare Other | Attending: Emergency Medicine | Admitting: Emergency Medicine

## 2021-09-09 ENCOUNTER — Encounter: Payer: Self-pay | Admitting: Emergency Medicine

## 2021-09-09 DIAGNOSIS — Z21 Asymptomatic human immunodeficiency virus [HIV] infection status: Secondary | ICD-10-CM | POA: Insufficient documentation

## 2021-09-09 DIAGNOSIS — R21 Rash and other nonspecific skin eruption: Secondary | ICD-10-CM | POA: Insufficient documentation

## 2021-09-09 DIAGNOSIS — F17210 Nicotine dependence, cigarettes, uncomplicated: Secondary | ICD-10-CM | POA: Insufficient documentation

## 2021-09-09 DIAGNOSIS — J449 Chronic obstructive pulmonary disease, unspecified: Secondary | ICD-10-CM | POA: Diagnosis not present

## 2021-09-09 MED ORDER — PREDNISONE 50 MG PO TABS: 50.0000 mg | ORAL_TABLET | Freq: Every day | ORAL | 0 refills | Status: AC

## 2021-09-09 NOTE — ED Provider Notes (Signed)
Emergency Medicine Provider Triage Evaluation Note  Patricia Luna , a 60 y.o. female  was evaluated in triage.  Pt complains of bilateral hand pain.  Redness and scaling.  Review of Systems  Positive: Redness to the hands, flaking and scaling Negative: No new contacts, no latex allergy, no chest pain, shortness of breath  Physical Exam  Ht 5\' 3"  (1.6 m)    Wt 49.9 kg    BMI 19.49 kg/m  Gen:   Awake, no distress   Resp:  Normal effort  MSK:   Moves extremities without difficulty  Other:    Medical Decision Making  Medically screening exam initiated at 12:20 PM.  Appropriate orders placed.  Patricia Luna was informed that the remainder of the evaluation will be completed by another provider, this initial triage assessment does not replace that evaluation, and the importance of remaining in the ED until their evaluation is complete.     Patricia Bailey, PA-C 09/09/21 1222    09/11/21, MD 09/09/21 1359

## 2021-09-09 NOTE — ED Triage Notes (Signed)
Presents with bilateral hand rash   states she has had similar episodes in past  blistery area  positive itchy

## 2021-09-09 NOTE — ED Provider Notes (Signed)
[facility] Emergency Department Provider Note   ____________________________________________    I have reviewed the triage vital signs and the nursing notes.   HISTORY  Chief Complaint Hand Problem (/)     HPI Patricia Luna is a 60 y.o. female with a history of HIV who presents with complaints of itchy rash to the palms of the hands bilaterally. She reports she had something similar which resolved with prednisone tx several months ago. Denies fevers. Has used eczema cream with some improvement. Does frequently wear gloves at work   Past Medical History:  Diagnosis Date   Anxiety    COPD (chronic obstructive pulmonary disease) (HCC)    Depression    Hearing loss    HIV (human immunodeficiency virus infection) (HCC)     Patient Active Problem List   Diagnosis Date Noted   Sepsis (HCC) 06/10/2018   Rectal pain 04/13/2018    Past Surgical History:  Procedure Laterality Date   CESAREAN SECTION     COLONOSCOPY  2018    Prior to Admission medications   Medication Sig Start Date End Date Taking? Authorizing Provider  predniSONE (DELTASONE) 50 MG tablet Take 1 tablet (50 mg total) by mouth daily with breakfast. 09/09/21  Yes Jene Every, MD  albuterol (PROVENTIL HFA;VENTOLIN HFA) 108 (90 Base) MCG/ACT inhaler Inhale 2 puffs into the lungs every 6 (six) hours as needed for wheezing or shortness of breath. 08/03/16   Willy Eddy, MD  aluminum-magnesium hydroxide-simethicone (MAALOX) 200-200-20 MG/5ML SUSP Take 30 mLs by mouth 4 (four) times daily -  before meals and at bedtime. 03/01/19   Sharman Cheek, MD  citalopram (CELEXA) 10 MG tablet Take 1 tablet by mouth daily. 10/20/17   [provider]  cyclobenzaprine (FLEXERIL) 5 MG tablet Take 1 tablet (5 mg total) by mouth 3 (three) times daily as needed. 04/30/21   Menshew, Charlesetta Ivory, PA-C  DESCOVY 200-25 MG tablet Take 1 tablet by mouth daily.  04/01/18   [provider]  famotidine (PEPCID) 20 MG tablet Take 1 tablet (20 mg total) by mouth 2 (two) times daily. 10/28/20   Irean Hong, MD  feeding supplement, ENSURE ENLIVE, (ENSURE ENLIVE) LIQD Take 237 mLs by mouth 2 (two) times daily between meals. 06/11/18   Alford Highland, MD  fluticasone (FLOVENT HFA) 220 MCG/ACT inhaler Inhale 2 puffs into the lungs 2 (two) times daily. Rinse out mouth 06/11/18   Alford Highland, MD  ibuprofen (ADVIL) 800 MG tablet Take 1 tablet (800 mg total) by mouth every 8 (eight) hours as needed. 04/30/21   Menshew, Charlesetta Ivory, PA-C  metoCLOPramide (REGLAN) 10 MG tablet Take 1 tablet (10 mg total) by mouth every 6 (six) hours as needed. 03/01/19   Sharman Cheek, MD  Multiple Vitamin (MULTIVITAMIN WITH MINERALS) TABS tablet Take 1 tablet by mouth daily. 06/12/18   Wieting, Richard, MD  TIVICAY 50 MG tablet Take 50 mg by mouth daily.  04/01/18   [provider]     Allergies Mushroom extract complex and Other  No family history on file.  Social History Social History   Tobacco Use   Smoking status: Every Day    Types: Cigarettes    Last attempt to quit: 08/18/2016    Years since quitting: 5.0   Smokeless tobacco: Never  Substance Use Topics   Alcohol use: Yes   Drug use: No    Review of Systems  Constitutional: No fever/chills  ENT: No sore throat.  Gastrointestinal: No abdominal pain.  No nausea, no vomiting.   Genitourinary: Negative for dysuria. Musculoskeletal: Negative for back pain. Skin: as above Neurological: Negative for headaches     ____________________________________________   PHYSICAL EXAM:  VITAL SIGNS: ED Triage Vitals  Enc Vitals Group     BP 09/09/21 1220 128/87     Pulse Rate 09/09/21 1220 96     Resp 09/09/21 1220 20     Temp 09/09/21 1220 98.3 F (36.8 C)     Temp Source 09/09/21 1220 Oral     SpO2 09/09/21 1220 97 %     Weight 09/09/21 1206 49.9 kg (110 lb 0.2 oz)     Height 09/09/21 1206 1.6 m (5\' 3" )     Head  Circumference --      Peak Flow --      Pain Score 09/09/21 1310 0     Pain Loc --      Pain Edu? --      Excl. in GC? --      Constitutional: Alert and oriented. No acute distress. Pleasant and interactive  Nose: No congestion/rhinnorhea. Mouth/Throat: Mucous membranes are moist.   Cardiovascular: Normal rate, regular rhythm.  Respiratory: Normal respiratory effort.  No retractions. Genitourinary: deferred Musculoskeletal: No lower extremity tenderness nor edema.   Neurologic:  Normal speech and language. No gross focal neurologic deficits are appreciated.   Skin:  Skin is warm, dry and intact. Rough, dry, scaly rash to the palms and cracking noted to the skin of the fingers, b/l. Not c/w infection   ____________________________________________   LABS (all labs ordered are listed, but only abnormal results are displayed)  Labs Reviewed - No data to display ____________________________________________  EKG   ____________________________________________  RADIOLOGY   ____________________________________________   PROCEDURES  Procedure(s) performed: No  Procedures   Critical Care performed: No ____________________________________________   INITIAL IMPRESSION / ASSESSMENT AND PLAN / ED COURSE  Pertinent labs & imaging results that were available during my care of the patient were reviewed by me and considered in my medical decision making (see chart for details).   Exam is most c/w contact dermatitis. Will treat with prednisone. Recommend derm f/u if recurrent.   ____________________________________________   FINAL CLINICAL IMPRESSION(S) / ED DIAGNOSES  Final diagnoses:  Rash and nonspecific skin eruption      NEW MEDICATIONS STARTED DURING THIS VISIT:  Discharge Medication List as of 09/09/2021  1:09 PM     START taking these medications   Details  predniSONE (DELTASONE) 50 MG tablet Take 1 tablet (50 mg total) by mouth daily with breakfast.,  Starting Mon 09/09/2021, Normal         Note:  This document was prepared using Dragon voice recognition software and may include unintentional dictation errors.    09/11/2021, MD 09/09/21 [phone]

## 2021-09-24 ENCOUNTER — Other Ambulatory Visit: Payer: Self-pay

## 2021-09-24 ENCOUNTER — Ambulatory Visit (LOCAL_COMMUNITY_HEALTH_CENTER): Payer: Self-pay

## 2021-09-24 DIAGNOSIS — Z111 Encounter for screening for respiratory tuberculosis: Secondary | ICD-10-CM

## 2021-09-27 ENCOUNTER — Other Ambulatory Visit: Payer: Self-pay

## 2021-09-27 ENCOUNTER — Ambulatory Visit (LOCAL_COMMUNITY_HEALTH_CENTER): Payer: Self-pay

## 2021-09-27 DIAGNOSIS — Z111 Encounter for screening for respiratory tuberculosis: Secondary | ICD-10-CM

## 2021-09-27 LAB — TB SKIN TEST
Induration: 0 mm
TB Skin Test: NEGATIVE

## 2021-12-05 DIAGNOSIS — Z681 Body mass index (BMI) 19 or less, adult: Secondary | ICD-10-CM | POA: Diagnosis not present

## 2021-12-05 DIAGNOSIS — Z01419 Encounter for gynecological examination (general) (routine) without abnormal findings: Secondary | ICD-10-CM | POA: Diagnosis not present

## 2021-12-05 DIAGNOSIS — A6000 Herpesviral infection of urogenital system, unspecified: Secondary | ICD-10-CM | POA: Diagnosis not present

## 2021-12-05 DIAGNOSIS — E321 Abscess of thymus: Secondary | ICD-10-CM | POA: Diagnosis not present

## 2021-12-05 DIAGNOSIS — Z113 Encounter for screening for infections with a predominantly sexual mode of transmission: Secondary | ICD-10-CM | POA: Diagnosis not present

## 2021-12-05 DIAGNOSIS — Z0000 Encounter for general adult medical examination without abnormal findings: Secondary | ICD-10-CM | POA: Diagnosis not present

## 2021-12-05 DIAGNOSIS — R636 Underweight: Secondary | ICD-10-CM | POA: Diagnosis not present

## 2021-12-05 DIAGNOSIS — F1410 Cocaine abuse, uncomplicated: Secondary | ICD-10-CM | POA: Diagnosis not present

## 2021-12-05 DIAGNOSIS — B20 Human immunodeficiency virus [HIV] disease: Secondary | ICD-10-CM | POA: Diagnosis not present

## 2021-12-10 ENCOUNTER — Other Ambulatory Visit: Payer: Self-pay | Admitting: Nurse Practitioner

## 2021-12-10 DIAGNOSIS — Z1231 Encounter for screening mammogram for malignant neoplasm of breast: Secondary | ICD-10-CM

## 2021-12-16 DIAGNOSIS — E876 Hypokalemia: Secondary | ICD-10-CM | POA: Diagnosis not present

## 2022-01-30 DIAGNOSIS — F1410 Cocaine abuse, uncomplicated: Secondary | ICD-10-CM | POA: Diagnosis not present

## 2022-01-30 DIAGNOSIS — Z1389 Encounter for screening for other disorder: Secondary | ICD-10-CM | POA: Diagnosis not present

## 2022-01-30 DIAGNOSIS — H9190 Unspecified hearing loss, unspecified ear: Secondary | ICD-10-CM | POA: Diagnosis not present

## 2022-01-30 DIAGNOSIS — M25512 Pain in left shoulder: Secondary | ICD-10-CM | POA: Diagnosis not present

## 2022-01-30 DIAGNOSIS — B20 Human immunodeficiency virus [HIV] disease: Secondary | ICD-10-CM | POA: Diagnosis not present

## 2022-01-30 DIAGNOSIS — F321 Major depressive disorder, single episode, moderate: Secondary | ICD-10-CM | POA: Diagnosis not present

## 2022-01-30 DIAGNOSIS — Z0131 Encounter for examination of blood pressure with abnormal findings: Secondary | ICD-10-CM | POA: Diagnosis not present

## 2022-01-30 DIAGNOSIS — Z681 Body mass index (BMI) 19 or less, adult: Secondary | ICD-10-CM | POA: Diagnosis not present

## 2022-01-30 DIAGNOSIS — Z113 Encounter for screening for infections with a predominantly sexual mode of transmission: Secondary | ICD-10-CM | POA: Diagnosis not present

## 2022-01-30 DIAGNOSIS — E876 Hypokalemia: Secondary | ICD-10-CM | POA: Diagnosis not present

## 2022-06-11 ENCOUNTER — Emergency Department: Payer: Medicare HMO

## 2022-06-11 ENCOUNTER — Encounter: Payer: Self-pay | Admitting: Emergency Medicine

## 2022-06-11 ENCOUNTER — Emergency Department
Admission: EM | Admit: 2022-06-11 | Discharge: 2022-06-11 | Disposition: A | Discharge status: Home / Self Care | Payer: Medicare HMO | Attending: Emergency Medicine | Admitting: Emergency Medicine

## 2022-06-11 ENCOUNTER — Other Ambulatory Visit: Payer: Self-pay

## 2022-06-11 DIAGNOSIS — R1310 Dysphagia, unspecified: Secondary | ICD-10-CM | POA: Insufficient documentation

## 2022-06-11 DIAGNOSIS — R0789 Other chest pain: Secondary | ICD-10-CM | POA: Diagnosis not present

## 2022-06-11 DIAGNOSIS — M79602 Pain in left arm: Secondary | ICD-10-CM | POA: Insufficient documentation

## 2022-06-11 DIAGNOSIS — R079 Chest pain, unspecified: Secondary | ICD-10-CM | POA: Insufficient documentation

## 2022-06-11 DIAGNOSIS — M25512 Pain in left shoulder: Secondary | ICD-10-CM | POA: Insufficient documentation

## 2022-06-11 LAB — CBC WITH DIFFERENTIAL/PLATELET
Abs Immature Granulocytes: 0.01 10*3/uL (ref 0.00–0.07)
Basophils Absolute: 0 10*3/uL (ref 0.0–0.1)
Basophils Relative: 1 %
Eosinophils Absolute: 0.3 10*3/uL (ref 0.0–0.5)
Eosinophils Relative: 5 %
HCT: 37.3 % (ref 36.0–46.0)
Hemoglobin: 11.8 g/dL — ABNORMAL LOW (ref 12.0–15.0)
Immature Granulocytes: 0 %
Lymphocytes Relative: 34 %
Lymphs Abs: 1.7 10*3/uL (ref 0.7–4.0)
MCH: 29.6 pg (ref 26.0–34.0)
MCHC: 31.6 g/dL (ref 30.0–36.0)
MCV: 93.5 fL (ref 80.0–100.0)
Monocytes Absolute: 0.5 10*3/uL (ref 0.1–1.0)
Monocytes Relative: 10 %
Neutro Abs: 2.5 10*3/uL (ref 1.7–7.7)
Neutrophils Relative %: 50 %
Platelets: 280 10*3/uL (ref 150–400)
RBC: 3.99 MIL/uL (ref 3.87–5.11)
RDW: 13.7 % (ref 11.5–15.5)
WBC: 4.9 10*3/uL (ref 4.0–10.5)
nRBC: 0 % (ref 0.0–0.2)

## 2022-06-11 LAB — CBC
HCT: 36.9 % (ref 36.0–46.0)
Hemoglobin: 11.9 g/dL — ABNORMAL LOW (ref 12.0–15.0)
MCH: 29.9 pg (ref 26.0–34.0)
MCHC: 32.2 g/dL (ref 30.0–36.0)
MCV: 92.7 fL (ref 80.0–100.0)
Platelets: 260 10*3/uL (ref 150–400)
RBC: 3.98 MIL/uL (ref 3.87–5.11)
RDW: 13.7 % (ref 11.5–15.5)
WBC: 5.3 10*3/uL (ref 4.0–10.5)
nRBC: 0 % (ref 0.0–0.2)

## 2022-06-11 LAB — HEPATIC FUNCTION PANEL
ALT: 13 U/L (ref 0–44)
AST: 22 U/L (ref 15–41)
Albumin: 3.2 g/dL — ABNORMAL LOW (ref 3.5–5.0)
Alkaline Phosphatase: 74 U/L (ref 38–126)
Bilirubin, Direct: <0.1 mg/dL (ref 0.0–0.2)
Total Bilirubin: 0.4 mg/dL (ref 0.3–1.2)
Total Protein: 8.6 g/dL — ABNORMAL HIGH (ref 6.5–8.1)

## 2022-06-11 LAB — BASIC METABOLIC PANEL
Anion gap: 6 (ref 5–15)
BUN: 15 mg/dL (ref 8–23)
CO2: 27 mmol/L (ref 22–32)
Calcium: 8.7 mg/dL — ABNORMAL LOW (ref 8.9–10.3)
Chloride: 104 mmol/L (ref 98–111)
Creatinine, Ser: 0.62 mg/dL (ref 0.44–1.00)
GFR, Estimated: >60 mL/min (ref >60)
Glucose, Bld: 97 mg/dL (ref 70–99)
Potassium: 3.5 mmol/L (ref 3.5–5.1)
Sodium: 137 mmol/L (ref 135–145)

## 2022-06-11 LAB — TROPONIN I (HIGH SENSITIVITY): Troponin I (High Sensitivity): 4 ng/L (ref <18)

## 2022-06-11 LAB — CK: Total CK: 52 U/L (ref 38–234)

## 2022-06-11 NOTE — ED Provider Notes (Signed)
[facility] Provider Note    Event Date/Time   First MD Initiated Contact with Patient 06/11/22 1626     (approximate)   History   Chest Pain   HPI  Patricia Luna is a 61 y.o. female who reports for the last week or so she has been having trouble with solids seeming to be hard to swallow and get down into her stomach.  It happens but sometimes is uncomfortable sometimes it seems like it takes a little bit of time to happen.  Water is not giving her problem.  She is usually fairly strong but has been getting weaker especially today she feels quite weak.  Today she had an episode after she ate a tuna sandwich or the pain went to her left shoulder and her left arm seem to be weak.  This is all resolved.  She also has some is mobile 1 cm nodes that she points out to me under each arm.  They are up on the chest wall not quite up into the axilla      Physical Exam   Triage Vital Signs: ED Triage Vitals  Enc Vitals Group     BP 06/11/22 1351 (!) 152/106     Pulse Rate 06/11/22 1351 (!) 105     Resp 06/11/22 1351 16     Temp 06/11/22 1351 98.8 F (37.1 C)     Temp Source 06/11/22 1351 Oral     SpO2 06/11/22 1351 96 %     Weight --      Height 06/11/22 1346 5' 3.5" (1.613 m)     Head Circumference --      Peak Flow --      Pain Score 06/11/22 1346 7     Pain Loc --      Pain Edu? --      Excl. in GC? --     Most recent vital signs: Vitals:   06/11/22 1351 06/11/22 1713  BP: (!) 152/106 (!) 168/95  Pulse: (!) 105 85  Resp: 16 18  Temp: 98.8 F (37.1 C)   SpO2: 96% 96%    General: Awake, no distress.  CV:  Good peripheral perfusion.  Heart regular rate and rhythm no audible murmurs Resp:  Normal effort.  Lungs are clear Abd:  No distention.  Soft and nontender    ED Results / Procedures / Treatments   Labs (all labs ordered are listed, but only abnormal results are displayed) Labs Reviewed  BASIC METABOLIC PANEL - Abnormal; Notable for  the following components:      Result Value   Calcium 8.7 (*)    All other components within normal limits  CBC - Abnormal; Notable for the following components:   Hemoglobin 11.9 (*)    All other components within normal limits  HEPATIC FUNCTION PANEL - Abnormal; Notable for the following components:   Total Protein 8.6 (*)    Albumin 3.2 (*)    All other components within normal limits  CBC WITH DIFFERENTIAL/PLATELET - Abnormal; Notable for the following components:   Hemoglobin 11.8 (*)    All other components within normal limits  CK  TROPONIN I (HIGH SENSITIVITY)  TROPONIN I (HIGH SENSITIVITY)     EKG  EKG read interpreted by me shows normal sinus rhythm rate of 94 normal axis slight ST segment depression in 2 3 and V6.  This appears to been present in the end of February last year but not on February 6  of last year.   RADIOLOGY Chest x-ray read by radiology reviewed and interpreted by me does not show any acute disease   PROCEDURES:  Critical Care performed:   Procedures   MEDICATIONS ORDERED IN ED: Medications - No data to display   IMPRESSION / MDM / [place] / ED COURSE  I reviewed the triage vital signs and the nursing notes. ----------------------------------------- 5:30 PM on 06/11/2022 ----------------------------------------- Discussed with Dr. Vicente Males gastroenterology.  He offered to scope her in the morning but she refuses to stay.  She has animals at home but there is no one to take care of.  She promises to come back tomorrow.  We will make her n.p.o. after midnight.  Differential diagnosis includes, but is not limited to, based on the symptoms, esophageal web, gastritis, esophagitis, as esophageal spasm, esophageal mass, Candida deal esophagitis or other irritation of the esophagus  Patient's presentation is most consistent with acute complicated illness / injury requiring diagnostic workup. Patient has been having symptoms for a week so I  believe we do not have to do more than 1 troponin since the first 1 was negative.   FINAL CLINICAL IMPRESSION(S) / ED DIAGNOSES   Final diagnoses:  Difficulty swallowing solids     Rx / DC Orders   ED Discharge Orders     None        Note:  This document was prepared using Dragon voice recognition software and may include unintentional dictation errors.   Nena Polio, MD 06/12/22 [phone]

## 2022-06-11 NOTE — ED Triage Notes (Signed)
Pt via POV from home. Pt c/o L sided chest pain that radiates down her L arm. States it worse when she eats. Denies SOB but states that she just feels weak. Denies cardiac hx. Pt has a hx of HIV. Pt is A&Ox4 and NAD

## 2022-06-11 NOTE — ED Notes (Signed)
See triage note  Pt ambulatory with steady gait at this time.

## 2022-06-11 NOTE — ED Notes (Signed)
D/C, admission discussed and to return to ED tomo for admission and EGD, pt verbalized understanding. Pt ambulatory with steady gait on D/C.

## 2022-06-11 NOTE — ED Triage Notes (Signed)
First Nurse Note:  Arrives c/o chest pain and feeling tired x 1 day.  Patient is AAOx3.  Skin warm and dry. NAD

## 2022-06-11 NOTE — Discharge Instructions (Addendum)
Please return to the emergency room tomorrow.  We can admit you then and Dr. Vicente Males can look down in your esophagus with a scope to see what is been going on.  It would be much better if you could spend the night here and have it done earlier in the morning and go home but I understand that you cannot so we will let you go now although that is not the best thing to do.  Do not eat anything after midnight or drink anything after midnight so that we can do the endoscopy without any further delay.

## 2022-06-12 ENCOUNTER — Other Ambulatory Visit: Payer: Self-pay

## 2022-06-12 ENCOUNTER — Observation Stay
Admission: EM | Admit: 2022-06-12 | Discharge: 2022-06-12 | Discharge status: Against Medical Advice | Payer: Medicare HMO | Attending: Internal Medicine | Admitting: Internal Medicine

## 2022-06-12 DIAGNOSIS — E43 Unspecified severe protein-calorie malnutrition: Secondary | ICD-10-CM | POA: Diagnosis not present

## 2022-06-12 DIAGNOSIS — J449 Chronic obstructive pulmonary disease, unspecified: Secondary | ICD-10-CM | POA: Insufficient documentation

## 2022-06-12 DIAGNOSIS — R0789 Other chest pain: Secondary | ICD-10-CM | POA: Insufficient documentation

## 2022-06-12 DIAGNOSIS — Z79899 Other long term (current) drug therapy: Secondary | ICD-10-CM | POA: Diagnosis not present

## 2022-06-12 DIAGNOSIS — R1310 Dysphagia, unspecified: Secondary | ICD-10-CM | POA: Diagnosis not present

## 2022-06-12 DIAGNOSIS — Z21 Asymptomatic human immunodeficiency virus [HIV] infection status: Secondary | ICD-10-CM | POA: Diagnosis present

## 2022-06-12 DIAGNOSIS — R1319 Other dysphagia: Secondary | ICD-10-CM | POA: Diagnosis not present

## 2022-06-12 DIAGNOSIS — K219 Gastro-esophageal reflux disease without esophagitis: Secondary | ICD-10-CM | POA: Diagnosis present

## 2022-06-12 DIAGNOSIS — J45909 Unspecified asthma, uncomplicated: Secondary | ICD-10-CM | POA: Diagnosis not present

## 2022-06-12 DIAGNOSIS — F1410 Cocaine abuse, uncomplicated: Secondary | ICD-10-CM | POA: Diagnosis present

## 2022-06-12 DIAGNOSIS — Z720 Tobacco use: Secondary | ICD-10-CM | POA: Diagnosis not present

## 2022-06-12 DIAGNOSIS — B20 Human immunodeficiency virus [HIV] disease: Secondary | ICD-10-CM | POA: Diagnosis not present

## 2022-06-12 DIAGNOSIS — F17200 Nicotine dependence, unspecified, uncomplicated: Secondary | ICD-10-CM | POA: Diagnosis not present

## 2022-06-12 DIAGNOSIS — J4520 Mild intermittent asthma, uncomplicated: Secondary | ICD-10-CM

## 2022-06-12 LAB — URINE DRUG SCREEN, QUALITATIVE (ARMC ONLY)
Amphetamines, Ur Screen: NOT DETECTED
Barbiturates, Ur Screen: NOT DETECTED
Benzodiazepine, Ur Scrn: NOT DETECTED
Cannabinoid 50 Ng, Ur ~~LOC~~: NOT DETECTED
Cocaine Metabolite,Ur ~~LOC~~: POSITIVE — AB
MDMA (Ecstasy)Ur Screen: NOT DETECTED
Methadone Scn, Ur: NOT DETECTED
Opiate, Ur Screen: NOT DETECTED
Phencyclidine (PCP) Ur S: NOT DETECTED
Tricyclic, Ur Screen: NOT DETECTED

## 2022-06-12 LAB — TROPONIN I (HIGH SENSITIVITY): Troponin I (High Sensitivity): 5 ng/L (ref <18)

## 2022-06-12 MED ORDER — FLUTICASONE PROPIONATE HFA 220 MCG/ACT IN AERO: 2.0000 | INHALATION_SPRAY | Freq: Two times a day (BID) | RESPIRATORY_TRACT | Status: DC

## 2022-06-12 MED ORDER — HYDROMORPHONE HCL 1 MG/ML IJ SOLN: 1.0000 mg | INTRAMUSCULAR | Status: DC | PRN

## 2022-06-12 MED ORDER — SODIUM CHLORIDE 0.9 % IV SOLN: INTRAVENOUS | Status: DC

## 2022-06-12 MED ORDER — BUDESONIDE 0.5 MG/2ML IN SUSP: 0.5000 mg | Freq: Two times a day (BID) | RESPIRATORY_TRACT | Status: DC

## 2022-06-12 MED ORDER — NICOTINE 21 MG/24HR TD PT24: 21.0000 mg | MEDICATED_PATCH | Freq: Every day | TRANSDERMAL | Status: DC

## 2022-06-12 MED ORDER — HEPARIN SODIUM (PORCINE) 5000 UNIT/ML IJ SOLN: 5000.0000 [IU] | Freq: Three times a day (TID) | INTRAMUSCULAR | Status: DC

## 2022-06-12 MED ORDER — ACETAMINOPHEN 325 MG PO TABS: 650.0000 mg | ORAL_TABLET | Freq: Four times a day (QID) | ORAL | Status: DC | PRN

## 2022-06-12 MED ORDER — ACETAMINOPHEN 325 MG RE SUPP: 650.0000 mg | Freq: Four times a day (QID) | RECTAL | Status: DC | PRN

## 2022-06-12 MED ORDER — EMTRICITABINE-TENOFOVIR AF 200-25 MG PO TABS
1.0000 | ORAL_TABLET | Freq: Every day | ORAL | Status: DC
  Filled 2022-06-12: qty 1

## 2022-06-12 MED ORDER — ONDANSETRON HCL 4 MG/2ML IJ SOLN: 4.0000 mg | Freq: Four times a day (QID) | INTRAMUSCULAR | Status: DC | PRN

## 2022-06-12 MED ORDER — PANTOPRAZOLE SODIUM 40 MG IV SOLR: 40.0000 mg | Freq: Two times a day (BID) | INTRAVENOUS | Status: DC

## 2022-06-12 MED ORDER — ENOXAPARIN SODIUM 40 MG/0.4ML IJ SOSY: 40.0000 mg | PREFILLED_SYRINGE | INTRAMUSCULAR | Status: DC

## 2022-06-12 MED ORDER — ENSURE ENLIVE PO LIQD: 237.0000 mL | Freq: Two times a day (BID) | ORAL | Status: DC

## 2022-06-12 MED ORDER — ALBUTEROL SULFATE (2.5 MG/3ML) 0.083% IN NEBU: 3.0000 mL | INHALATION_SOLUTION | Freq: Four times a day (QID) | RESPIRATORY_TRACT | Status: DC | PRN

## 2022-06-12 MED ORDER — ONDANSETRON HCL 4 MG PO TABS: 4.0000 mg | ORAL_TABLET | Freq: Four times a day (QID) | ORAL | Status: DC | PRN

## 2022-06-12 MED ORDER — CITALOPRAM HYDROBROMIDE 20 MG PO TABS: 10.0000 mg | ORAL_TABLET | Freq: Every day | ORAL | Status: DC

## 2022-06-12 NOTE — Assessment & Plan Note (Signed)
-   Once her weight is taken BMI will be able to be calculated. -But she is chronically frail and expressed losing a lot of weight. -Condition worsening the last week or so with her inability to swallow. -Feeding supplements twice a day has been ordered.

## 2022-06-12 NOTE — Assessment & Plan Note (Signed)
-   Cessation counseling provided -Nicotine patch ordered -Patient expressed wanting to quit smoking.

## 2022-06-12 NOTE — Assessment & Plan Note (Signed)
-   Continue PPI. -Currently twice a day and given through her balance due to ongoing dysphagia with high concerns for esophagitis process.

## 2022-06-12 NOTE — Discharge Summary (Signed)
Physician Discharge Summary   Patient: Patricia Luna MRN: [id number] DOB: [birth year]  Admit date:     06/12/2022  Discharge date: 06/12/22  Discharge Physician: Barton Dubois   PCP: Theotis Burrow, MD   Recommendations at discharge:  Patient left AMA.  Discharge Diagnoses: Principal Problem:   Dysphagia Active Problems:   HIV (human immunodeficiency virus infection) ([place])   Protein-calorie malnutrition, severe (HCC)   Tobacco abuse   GERD (gastroesophageal reflux disease)   Asthma, chronic   Cocaine abuse [facility])   Hospital Course: Johniya Gohring is a 61 y.o. female with medical history significant of tobacco abuse, COPD, HIV, cocaine use, gastroesophageal reflux disease and severe protein calorie malnutrition; presented to the hospital secondary to difficulty swallowing.  Patient reports symptom has been present for the last week or so and worsening.  She was seen yesterday on 06/11/2022 in the ED and decided to leave, as she had some pets at home that nobody could take care of.  Patient presented again today reporting that her symptoms has worsened and that she is having trouble even swallowing her own saliva now.  Patient reported chest discomfort driven by her odynophagia process.   No fever, chills, hematemesis, melena, hematochezia, abdominal pain, focal weakness, dysuria, hematuria, headaches or any other complaints.   In the ED negative troponin, chest x-ray from 06/11/2022 without acute cardiopulmonary process, UDS positive for cocaine, stable vital signs.  GI consulted for endoscopy evaluation and TRH asked to place in the hospital for further evaluation and management.  Assessment and Plan: * Dysphagia - With concerns for dysphagia and odynophagia; currently expressing difficulty swallowing on saliva. -Condition has progressively worsen over the last week or so. -Currently n.p.o. except for sips with water -GI service consulted for endoscopic evaluation -Gentle  fluid resuscitation will be provided; PPI IV twice a day initiated. -Will follow clinical response and further recommendations. -High concerns for infectious esophagitis versus strictures.  Cocaine abuse ([place]) - Patient has expressed smoking crack on Monday (06/09/22) -UDS positive -cessation counseling provided.  Asthma, chronic - No wheezing on exam. -Continue as needed bronchodilator management. -Resume the use of Flovent  GERD (gastroesophageal reflux disease) - Continue PPI. -Currently twice a day and given through her balance due to ongoing dysphagia with high concerns for esophagitis process.  Tobacco abuse - Cessation counseling provided -Nicotine patch ordered -Patient expressed wanting to quit smoking.  Protein-calorie malnutrition, severe ([place]) - Once her weight is taken BMI will be able to be calculated. -But she is chronically frail and expressed losing a lot of weight. -Condition worsening the last week or so with her inability to swallow. -Feeding supplements twice a day has been ordered.  HIV (human immunodeficiency virus infection) ([place]) - Patient reports actively following at the health department. -Unfortunately expressed taking medications inconsistently. -will check CD4 count and viral load. -resume the use of Descovy.  PATIENT LET AMA  Consultants: GI Procedures performed: none   Disposition: Patient left AMA   DISCHARGE MEDICATION: Patient left AGAINST MEDICAL ADVICE.  Discharge Exam: Refer to admission note. Patient left AMA   Condition at discharge: per nursing report and chart review patient was hemodynamically stable.  The results of significant diagnostics from this hospitalization (including imaging, microbiology, ancillary and laboratory) are listed below for reference.   Imaging Studies: DG Chest 2 View  Result Date: 06/11/2022 CLINICAL DATA:  Chest pain EXAM: CHEST - 2 VIEW COMPARISON:  11/17/2020 FINDINGS: Cardiac and mediastinal  contours are within normal limits. Previously noted patchy  airspace opacities are no longer seen. No focal pulmonary opacity. No pleural effusion or pneumothorax. No acute osseous abnormality. IMPRESSION: No acute cardiopulmonary process. Electronically Signed   By: Merilyn Baba M.D.   On: 06/11/2022 14:29    Microbiology: Results for orders placed or performed during the hospital encounter of 11/17/20  Resp Panel by RT-PCR (Flu A&B, Covid) Nasopharyngeal Swab     Status: None   Collection Time: 11/17/20  9:46 PM   Specimen: Nasopharyngeal Swab; Nasopharyngeal(NP) swabs in vial transport medium  Result Value Ref Range Status   SARS Coronavirus 2 by RT PCR NEGATIVE NEGATIVE Final    Comment: (NOTE) SARS-CoV-2 target nucleic acids are NOT DETECTED.  The SARS-CoV-2 RNA is generally detectable in upper respiratory specimens during the acute phase of infection. The lowest concentration of SARS-CoV-2 viral copies this assay can detect is 138 copies/mL. A negative result does not preclude SARS-Cov-2 infection and should not be used as the sole basis for treatment or other patient management decisions. A negative result may occur with  improper specimen collection/handling, submission of specimen other than nasopharyngeal swab, presence of viral mutation(s) within the areas targeted by this assay, and inadequate number of viral copies(<138 copies/mL). A negative result must be combined with clinical observations, patient history, and epidemiological information. The expected result is Negative.  Fact Sheet for Patients:  EntrepreneurPulse.com.au  Fact Sheet for Healthcare Providers:  IncredibleEmployment.be  This test is no t yet approved or cleared by the [place] FDA and  has been authorized for detection and/or diagnosis of SARS-CoV-2 by FDA under an Emergency Use Authorization (EUA). This EUA will remain  in effect (meaning this test can be used)  for the duration of the COVID-19 declaration under Section 564(b)(1) of the Act, 21 U.S.C.section 360bbb-3(b)(1), unless the authorization is terminated  or revoked sooner.       Influenza A by PCR NEGATIVE NEGATIVE Final   Influenza B by PCR NEGATIVE NEGATIVE Final    Comment: (NOTE) The Xpert Xpress SARS-CoV-2/FLU/RSV plus assay is intended as an aid in the diagnosis of influenza from Nasopharyngeal swab specimens and should not be used as a sole basis for treatment. Nasal washings and aspirates are unacceptable for Xpert Xpress SARS-CoV-2/FLU/RSV testing.  Fact Sheet for Patients: EntrepreneurPulse.com.au  Fact Sheet for Healthcare Providers: IncredibleEmployment.be  This test is not yet approved or cleared by the [place] FDA and has been authorized for detection and/or diagnosis of SARS-CoV-2 by FDA under an Emergency Use Authorization (EUA). This EUA will remain in effect (meaning this test can be used) for the duration of the COVID-19 declaration under Section 564(b)(1) of the Act, 21 U.S.C. section 360bbb-3(b)(1), unless the authorization is terminated or revoked.  Performed at [facility], [address] [place], [place] [address]     Labs: CBC: Recent Labs  Lab 06/11/22 1351  WBC 4.9  5.3  NEUTROABS 2.5  HGB 11.8*  11.9*  HCT 37.3  36.9  MCV 93.5  92.7  PLT 280  123456   Basic Metabolic Panel: Recent Labs  Lab 06/11/22 1351  NA 137  K 3.5  CL 104  CO2 27  GLUCOSE 97  BUN 15  CREATININE 0.62  CALCIUM 8.7*   Liver Function Tests: Recent Labs  Lab 06/11/22 1351  AST 22  ALT 13  ALKPHOS 74  BILITOT 0.4  PROT 8.6*  ALBUMIN 3.2*   CBG: No results for input(s): "GLUCAP" in the last 168 hours.  Discharge time spent: less  than 30 minutes.  Signed: Barton Dubois, MD [facility] 06/12/2022

## 2022-06-12 NOTE — Assessment & Plan Note (Signed)
-   No wheezing on exam. -Continue as needed bronchodilator management. -Resume the use of Flovent

## 2022-06-12 NOTE — Assessment & Plan Note (Signed)
-   Patient has expressed smoking crack on Monday (06/09/22) -UDS positive -cessation counseling provided.

## 2022-06-12 NOTE — ED Notes (Signed)
Secure chat sent to Dr Dyann Kief about pt potentially not wanting to stay if she is not going to get her scope done about AMA vs discharge

## 2022-06-12 NOTE — ED Notes (Signed)
Per MD pt is going to be an [place]

## 2022-06-12 NOTE — ED Notes (Signed)
GI at bedside

## 2022-06-12 NOTE — Consult Note (Signed)
GI Inpatient Consult Note  Reason for Consult: Esophageal dysphagia    Attending Requesting Consult: Dr. Artis Delay, MD  History of Present Illness: Patricia Luna is a 61 y.o. female seen for evaluation of esophageal dysphagia and non-cardiac chest pain at the request of ED physician - Dr. Artis Delay. Patient has a PMH of HIV, anxiety, COPD, depression, and hx of cocaine use. She [facility] ED yesterday afternoon for chief complaint of esophageal dysphagia and substernal chest pain which started after eating tuna sandwich at 2 PM yesterday. She was offered to have EGD this morning by Dr. Tobi Bastos, but refused because she didn't want to wait in the hospital overnight as she had to get home to tend to her animals. She went home last night and had some baked chicken and was able to tolerate this just fine. She reports this morning after eating she felt like food had a hard time going down and had the same chest pain so she returned to the ED. Upon presentation to the ED, she was hypertensive 152/82 and otherwise stable vital signs. Labs showed no significant leukocytosis, anemia, or electrolyte derangement. Chest x-ray was negative. GI consulted this morning in context of dysphagia and chest pain.   Patient seen and evaluated this morning resting comfortable in ED stretcher. Son is present bedside. No acute events since admission. She denies any difficulties tolerating her own secretions. She denies any issues with swallowing liquids. She reports over the past three weeks she has had a lot of difficulties with solid food dysphagia to chicken, roast beef, and steak. She feels like when she swallows it gets to her mid-sternum and then gets stuck before it eventually passes. When it gets stuck it hurts the rest of the way down and pain radiates down her left arm. She does not have to regurgitate the food bolus. She denies any known previous endoscopies. She denies any history of GERD. She does not  take any acid suppression daily. She denies any complaints of nausea, vomiting, early satiety, hoarseness, or epigastric abdominal pain. No change in her bowel habits. She does have diagnosis of HIV. She reports she does smoke tobacco daily. She does endorse smoking crack cocaine with friends last on Monday evening.    Past Medical History:  Past Medical History:  Diagnosis Date   Anxiety    COPD (chronic obstructive pulmonary disease) (HCC)    Depression    Hearing loss    HIV (human immunodeficiency virus infection) (HCC)     Problem List: Patient Active Problem List   Diagnosis Date Noted   Difficulty swallowing 06/12/2022   Sepsis (HCC) 06/10/2018   Rectal pain 04/13/2018    Past Surgical History: Past Surgical History:  Procedure Laterality Date   CESAREAN SECTION     COLONOSCOPY  2018    Allergies: Allergies  Allergen Reactions   Other     (patient thinks Vancomycin) A shot they gave me and I wound up in ICU    Mushroom Extract Complex     Reaction type: Headache    Home Medications: (Not in a hospital admission)  Home medication reconciliation was completed with the patient.   Scheduled Inpatient Medications:    heparin injection (subcutaneous)  5,000 Units Subcutaneous Q8H    Continuous Inpatient Infusions:    PRN Inpatient Medications:    Family History: family history is not on file.  The patient's family history is negative for inflammatory bowel disorders, GI malignancy, or solid  organ transplantation.  Social History:   reports that she has been smoking. She has never used smokeless tobacco. She reports current alcohol use. She reports that she does not use drugs. The patient denies ETOH, tobacco, or drug use.   Review of Systems: Constitutional: Weight is stable.  Eyes: No changes in vision. ENT: No oral lesions, sore throat.  GI: see HPI.  Heme/Lymph: No easy bruising.  CV: No chest pain.  GU: No hematuria.  Integumentary: No rashes.   Neuro: No headaches.  Psych: No depression/anxiety.  Endocrine: No heat/cold intolerance.  Allergic/Immunologic: No urticaria.  Resp: No cough, SOB.  Musculoskeletal: No joint swelling.    Physical Examination: BP (!) 143/107   Pulse 81   Temp 98.2 F (36.8 C) (Oral)   Resp 17   SpO2 100%  Gen: NAD, alert and oriented x 4 HEENT: PEERLA, EOMI, Neck: supple, no JVD or thyromegaly Chest: CTA bilaterally, no wheezes, crackles, or other adventitious sounds CV: RRR, no m/g/c/r Abd: soft, NT, ND, +BS in all four quadrants; no HSM, guarding, ridigity, or rebound tenderness Ext: no edema, well perfused with 2+ pulses, Skin: no rash or lesions noted Lymph: no LAD  Data: Lab Results  Component Value Date   WBC 5.3 06/11/2022   WBC 4.9 06/11/2022   HGB 11.9 (L) 06/11/2022   HGB 11.8 (L) 06/11/2022   HCT 36.9 06/11/2022   HCT 37.3 06/11/2022   MCV 92.7 06/11/2022   MCV 93.5 06/11/2022   PLT 260 06/11/2022   PLT 280 06/11/2022   Recent Labs  Lab 06/11/22 1351  HGB 11.8*  11.9*   Lab Results  Component Value Date   NA 137 06/11/2022   K 3.5 06/11/2022   CL 104 06/11/2022   CO2 27 06/11/2022   BUN 15 06/11/2022   CREATININE 0.62 06/11/2022   Lab Results  Component Value Date   ALT 13 06/11/2022   AST 22 06/11/2022   ALKPHOS 74 06/11/2022   BILITOT 0.4 06/11/2022   No results for input(s): "APTT", "INR", "PTT" in the last 168 hours.  Assessment/Plan:  61 y/o AA female with a PMH of HIV, anxiety, depression, COPD, and hx of polysubstance abuse presents to the [facility] ED this morning for chief complaint of 3-4-week history of solid food esophageal dysphagia and non-cardiac chest pain  Esophageal dysphagia - DDx includes infectious esophagitis (HSV, CMV, esophageal stricture, Schatzki ring, esophageal adenocarcinoma, esophageal motility disorder, etc  Non-cardiac chest pain  Hx of polysubstance abuse - endorses smoking crack cocaine 3 days ago  Immunosuppression -  Hx of HIV  Recommendations:  - STAT UDS given hx of recent crack cocaine use - Patient is clinically stable with no respiratory distress. Clinically does not present like a esophageal food impaction. I am suspicious for infectious esophagitis (HSV, CMV) given her hx of HIV on chronic immunosuppression versus esophageal stricture.  - Patient would benefit from EGD with endoluminal evaluation with biopsies +/- dilatation. If UDS is negative, can perform EGD today with Dr. Virgina Jock. If UDS is positive, patient will need to follow-up as outpatient to arrange EGD. I can arrange follow-up in my clinic.  - Patient was advised to avoid crack cocaine and other illicit substances - Initiate PPI for gastric protection  - Plan TBD after UDS results   Thank you for the consult. Please call with questions or concerns.  Geanie Kenning, PA-C [facility] [phone]

## 2022-06-12 NOTE — H&P (Signed)
History and Physical    Patient: Patricia Luna [id number] DOB: November 18, 1960 DOA: 06/12/2022 DOS: the patient was seen and examined on 06/12/2022 PCP: Alene Mires Elyse Jarvis, MD  Patient coming from: Home  Chief Complaint:  Chief Complaint  Patient presents with   trouble swallowing   HPI: Patricia Luna is a 61 y.o. female with medical history significant of tobacco abuse, COPD, HIV, cocaine use, gastroesophageal reflux disease and severe protein calorie malnutrition; presented to the hospital secondary to difficulty swallowing.  Patient reports symptom has been present for the last week or so and worsening.  She was seen yesterday on 06/11/2022 in the ED and decided to leave, as she had some pets at home that nobody could take care of.  Patient presented again today reporting that her symptoms has worsened and that she is having trouble even swallowing her own saliva now.  Patient reported chest discomfort driven by her odynophagia process.  No fever, chills, hematemesis, melena, hematochezia, abdominal pain, focal weakness, dysuria, hematuria, headaches or any other complaints.  In the ED negative troponin, chest x-ray from 06/11/2022 without acute cardiopulmonary process, UDS positive for cocaine, stable vital signs.  GI consulted for endoscopy evaluation and TRH asked to place in the hospital for further evaluation and management.   Review of Systems: As mentioned in the history of present illness. All other systems reviewed and are negative.  Past Medical History:  Diagnosis Date   Anxiety    COPD (chronic obstructive pulmonary disease) ([place])    Depression    Hearing loss    HIV (human immunodeficiency virus infection) ([place])    Past Surgical History:  Procedure Laterality Date   CESAREAN SECTION     COLONOSCOPY  2018   Social History:  reports that she has been smoking. She has never used smokeless tobacco. She reports current alcohol use. She reports that she does not use  drugs.  Allergies  Allergen Reactions   Other     (patient thinks Vancomycin) A shot they gave me and I wound up in ICU    Mushroom Extract Complex     Reaction type: Headache    No family history on file.  Prior to Admission medications   Medication Sig Start Date End Date Taking? Authorizing Provider  aluminum-magnesium hydroxide-simethicone (MAALOX) [phone] MG/5ML SUSP Take 30 mLs by mouth 4 (four) times daily -  before meals and at bedtime. 03/01/19  Yes Carrie Mew, MD  citalopram (CELEXA) 10 MG tablet Take 1 tablet by mouth daily. 10/20/17  Yes [provider]  cyclobenzaprine (FLEXERIL) 5 MG tablet Take 1 tablet (5 mg total) by mouth 3 (three) times daily as needed. 04/30/21  Yes Menshew, Dannielle Karvonen, PA-C  DESCOVY 200-25 MG tablet Take 1 tablet by mouth daily.  04/01/18  Yes [provider]  famotidine (PEPCID) 20 MG tablet Take 1 tablet (20 mg total) by mouth 2 (two) times daily. 10/28/20  Yes Paulette Blanch, MD  ibuprofen (ADVIL) 800 MG tablet Take 1 tablet (800 mg total) by mouth every 8 (eight) hours as needed. 04/30/21  Yes Menshew, Dannielle Karvonen, PA-C  metoCLOPramide (REGLAN) 10 MG tablet Take 1 tablet (10 mg total) by mouth every 6 (six) hours as needed. 03/01/19  Yes Carrie Mew, MD  Multiple Vitamin (MULTIVITAMIN WITH MINERALS) TABS tablet Take 1 tablet by mouth daily. 06/12/18  Yes Wieting, Richard, MD  albuterol (PROVENTIL HFA;VENTOLIN HFA) 108 (90 Base) MCG/ACT inhaler Inhale 2 puffs into the lungs every 6 (six)  hours as needed for wheezing or shortness of breath. 08/03/16   Willy Eddy, MD  feeding supplement, ENSURE ENLIVE, (ENSURE ENLIVE) LIQD Take 237 mLs by mouth 2 (two) times daily between meals. 06/11/18   Alford Highland, MD  fluticasone (FLOVENT HFA) 220 MCG/ACT inhaler Inhale 2 puffs into the lungs 2 (two) times daily. Rinse out mouth 06/11/18   Alford Highland, MD  predniSONE (DELTASONE) 50 MG tablet Take 1 tablet (50 mg total) by  mouth daily with breakfast. Patient not taking: Reported on 06/12/2022 09/09/21   Jene Every, MD  TIVICAY 50 MG tablet Take 50 mg by mouth daily.  Patient not taking: Reported on 06/12/2022 04/01/18   [provider]    Physical Exam: Vitals:   06/12/22 1028 06/12/22 1040 06/12/22 1100 06/12/22 1200  BP: (!) 152/85 (!) 143/107 (!) 147/89 (!) 152/95  Pulse: 79 81 80 87  Resp: 17   18  Temp: 98.2 F (36.8 C)     TempSrc: Oral     SpO2: 100% 100% 100% 100%   General exam: Alert, awake, oriented x 3; reporting some chest discomfort while trying to swallowing her own saliva.  Patient is chronically ill in appearance, frail and significantly underweight. Respiratory system: Positive scattered rhonchi, no wheezing, no using accessory muscle.  Good saturations on room air. Cardiovascular system:RRR. No murmurs, rubs, gallops. Gastrointestinal system: Abdomen is nondistended, soft and nontender. No organomegaly or masses felt. Normal bowel sounds heard. Central nervous system: Alert and oriented. No focal neurological deficits. Extremities: No cyanosis or clubbing Skin: No petechiae. Psychiatry: Judgement and insight appear normal. Mood & affect appropriate.   Data Reviewed: High sensitive troponin: 4>>5 CBC: WBC 5.3, hemoglobin 11.1 and platelet count 260 K Basic metabolic panel: Sodium 137, potassium 3.5, chloride 104, bicarb 27, BUN 15, creatinine 0.60 and GFR > than 60. UDS: positive cocaine.  Assessment and Plan: * Dysphagia - With concerns for dysphagia and odynophagia; currently expressing difficulty swallowing on saliva. -Condition has progressively worsen over the last week or so. -Currently n.p.o. except for sips with water -GI service consulted for endoscopic evaluation -Gentle fluid resuscitation will be provided; PPI IV twice a day initiated. -Will follow clinical response and further recommendations. -High concerns for infectious esophagitis versus  strictures.  Cocaine abuse (HCC) - Patient has expressed smoking crack on Monday (06/09/22) -UDS positive -cessation counseling provided.  Asthma, chronic - No wheezing on exam. -Continue as needed bronchodilator management. -Resume the use of Flovent  GERD (gastroesophageal reflux disease) - Continue PPI. -Currently twice a day and given through her balance due to ongoing dysphagia with high concerns for esophagitis process.  Tobacco abuse - Cessation counseling provided -Nicotine patch ordered -Patient expressed wanting to quit smoking.  Protein-calorie malnutrition, severe (HCC) - Once her weight is taken BMI will be able to be calculated. -But she is chronically frail and expressed losing a lot of weight. -Condition worsening the last week or so with her inability to swallow. -Feeding supplements twice a day has been ordered.  HIV (human immunodeficiency virus infection) (HCC) - Patient reports actively following at the health department. -Unfortunately expressed taking medications inconsistently. -will check CD4 count and viral load. -resume the use of Descovy.   Advance Care Planning:   Code Status: Full Code   Consults: GI service  Family Communication: Son at bedside.  Severity of Illness: The appropriate patient status for this patient is OBSERVATION. Observation status is judged to be reasonable and necessary in order to provide the required  intensity of service to ensure the patient's safety. The patient's presenting symptoms, physical exam findings, and initial radiographic and laboratory data in the context of their medical condition is felt to place them at decreased risk for further clinical deterioration. Furthermore, it is anticipated that the patient will be medically stable for discharge from the hospital within 2 midnights of admission.   Author: Vassie Loll, MD 06/12/2022 12:50 PM  For on call review [email or web address].

## 2022-06-12 NOTE — Assessment & Plan Note (Addendum)
-   With concerns for dysphagia and odynophagia; currently expressing difficulty swallowing on saliva. -Condition has progressively worsen over the last week or so. -Currently n.p.o. except for sips with water -GI service consulted for endoscopic evaluation -Gentle fluid resuscitation will be provided; PPI IV twice a day initiated. -Will follow clinical response and further recommendations. -High concerns for infectious esophagitis versus strictures.

## 2022-06-12 NOTE — Assessment & Plan Note (Signed)
-   Patient reports actively following at the health department. -Unfortunately expressed taking medications inconsistently. -will check CD4 count and viral load. -resume the use of Descovy.

## 2022-06-12 NOTE — ED Triage Notes (Addendum)
Pt comes with c/o trouble swallowing. Pt was seen here yesterday and was suppose to be admitted and procedure to be performed. Pt refused and had home responsibilities but promised to come back today.  Pt states it has now gotten worse and more when trying to swallow her own saliva.   Pt has been npo since midnight

## 2022-06-12 NOTE — ED Provider Notes (Signed)
[facility] Provider Note    Event Date/Time   First MD Initiated Contact with Patient 06/12/22 1043     (approximate)   History   trouble swallowing   HPI  Patricia Luna is a 61 y.o. female who comes in with concern for difficulty swallowing solids.  Patient was seen here yesterday where she had pain after eating a tuna sandwich and her left shoulder.  The case was discussed with GI and he had offered to scope her in the morning but patient had refused to stay due to need to check in on her animals.  Patient reports being n.p.o. since midnight.  Patient does report having 2 nodules by her bilateral armpits that she noticed as well.  She denies any weakness on one side of her body or other concerns.  She states that she feels a little bit harder to swallow her saliva.     Physical Exam   Triage Vital Signs: ED Triage Vitals  Enc Vitals Group     BP 06/12/22 1028 (!) 152/85     Pulse Rate 06/12/22 1028 79     Resp 06/12/22 1028 17     Temp 06/12/22 1028 98.2 F (36.8 C)     Temp Source 06/12/22 1028 Oral     SpO2 06/12/22 1028 100 %     Weight --      Height --      Head Circumference --      Peak Flow --      Pain Score 06/12/22 1034 10     Pain Loc --      Pain Edu? --      Excl. in [place]? --     Most recent vital signs: Vitals:   06/12/22 1028 06/12/22 1040  BP: (!) 152/85 (!) 143/107  Pulse: 79 81  Resp: 17   Temp: 98.2 F (36.8 C)   SpO2: 100% 100%     General: Awake, no distress.  CV:  Good peripheral perfusion.  Resp:  Normal effort.  Abd:  No distention.  Other:  Oropharynx is normal.   ED Results / Procedures / Treatments   Labs (all labs ordered are listed, but only abnormal results are displayed) Labs Reviewed  TROPONIN I (HIGH SENSITIVITY)     EKG  My interpretation of EKG:  Normal sinus rate of 68, no st elevation, twi in avl v2   RADIOLOGY I have reviewed the xray personally and interpreted from  yesterday and there is no evidence of any pneumonia  PROCEDURES:  Critical Care performed: No  .1-3 Lead EKG Interpretation  Performed by: Vanessa Wilton, MD Authorized by: Vanessa Akron, MD     Interpretation: normal     ECG rate:  80   ECG rate assessment: normal     Rhythm: sinus rhythm     Ectopy: none     Conduction: normal      MEDICATIONS ORDERED IN ED: Medications - No data to display   IMPRESSION / MDM / [place] / ED COURSE  I reviewed the triage vital signs and the nursing notes.   Patient's presentation is most consistent with acute presentation with potential threat to life or bodily function.   This is concerning for acute pathology such as ACS although had EKG and troponin that was negative yesterday I will get repeat EKG and troponin.  These were reassuring.  But given her dysphagia with lymph nodes and also concern about possible  esophagitis, cancer.  Given the plan yesterday was to admit for GI doctor will discuss with GI.   10:55 AM discussed with Dr. Timothy Lasso who is going to discuss with Dr. Tobi Bastos  I reviewed the rest the patient's blood work from yesterday where her BMP was normal.  CBC slightly low hemoglobin.  GI recommended admission for endoscopic today.  I did discuss with the hospital team for admission  The patient is on the cardiac monitor to evaluate for evidence of arrhythmia and/or significant heart rate changes.      FINAL CLINICAL IMPRESSION(S) / ED DIAGNOSES   Final diagnoses:  Dysphagia, unspecified type     Rx / DC Orders   ED Discharge Orders     None        Note:  This document was prepared using Dragon voice recognition software and may include unintentional dictation errors.   Concha Se, MD 06/12/22 1455

## 2022-06-18 ENCOUNTER — Encounter: Payer: Self-pay | Admitting: Emergency Medicine

## 2022-06-18 ENCOUNTER — Emergency Department: Payer: Medicare HMO

## 2022-06-18 ENCOUNTER — Other Ambulatory Visit: Payer: Self-pay

## 2022-06-18 ENCOUNTER — Emergency Department
Admission: EM | Admit: 2022-06-18 | Discharge: 2022-06-18 | Disposition: A | Discharge status: Home / Self Care | Payer: Medicare HMO | Attending: Emergency Medicine | Admitting: Emergency Medicine

## 2022-06-18 DIAGNOSIS — D649 Anemia, unspecified: Secondary | ICD-10-CM | POA: Insufficient documentation

## 2022-06-18 DIAGNOSIS — R1310 Dysphagia, unspecified: Secondary | ICD-10-CM | POA: Insufficient documentation

## 2022-06-18 DIAGNOSIS — Z21 Asymptomatic human immunodeficiency virus [HIV] infection status: Secondary | ICD-10-CM | POA: Diagnosis not present

## 2022-06-18 DIAGNOSIS — J449 Chronic obstructive pulmonary disease, unspecified: Secondary | ICD-10-CM | POA: Diagnosis not present

## 2022-06-18 DIAGNOSIS — R0789 Other chest pain: Secondary | ICD-10-CM | POA: Diagnosis not present

## 2022-06-18 DIAGNOSIS — R079 Chest pain, unspecified: Secondary | ICD-10-CM | POA: Diagnosis not present

## 2022-06-18 LAB — URINE DRUG SCREEN, QUALITATIVE (ARMC ONLY)
Amphetamines, Ur Screen: NOT DETECTED
Barbiturates, Ur Screen: NOT DETECTED
Benzodiazepine, Ur Scrn: NOT DETECTED
Cannabinoid 50 Ng, Ur ~~LOC~~: NOT DETECTED
Cocaine Metabolite,Ur ~~LOC~~: POSITIVE — AB
MDMA (Ecstasy)Ur Screen: NOT DETECTED
Methadone Scn, Ur: NOT DETECTED
Opiate, Ur Screen: NOT DETECTED
Phencyclidine (PCP) Ur S: NOT DETECTED
Tricyclic, Ur Screen: NOT DETECTED

## 2022-06-18 LAB — URINALYSIS, ROUTINE W REFLEX MICROSCOPIC
Bacteria, UA: NONE SEEN
Bilirubin Urine: NEGATIVE
Glucose, UA: NEGATIVE mg/dL
Hgb urine dipstick: NEGATIVE
Ketones, ur: NEGATIVE mg/dL
Leukocytes,Ua: NEGATIVE
Nitrite: NEGATIVE
Protein, ur: 30 mg/dL — AB
Specific Gravity, Urine: 1.017 (ref 1.005–1.030)
pH: 6 (ref 5.0–8.0)

## 2022-06-18 LAB — CBC
HCT: 37 % (ref 36.0–46.0)
Hemoglobin: 11.7 g/dL — ABNORMAL LOW (ref 12.0–15.0)
MCH: 29.5 pg (ref 26.0–34.0)
MCHC: 31.6 g/dL (ref 30.0–36.0)
MCV: 93.2 fL (ref 80.0–100.0)
Platelets: 240 10*3/uL (ref 150–400)
RBC: 3.97 MIL/uL (ref 3.87–5.11)
RDW: 13.5 % (ref 11.5–15.5)
WBC: 4.1 10*3/uL (ref 4.0–10.5)
nRBC: 0 % (ref 0.0–0.2)

## 2022-06-18 LAB — BASIC METABOLIC PANEL
Anion gap: 5 (ref 5–15)
BUN: 14 mg/dL (ref 8–23)
CO2: 24 mmol/L (ref 22–32)
Calcium: 8.8 mg/dL — ABNORMAL LOW (ref 8.9–10.3)
Chloride: 109 mmol/L (ref 98–111)
Creatinine, Ser: 0.44 mg/dL (ref 0.44–1.00)
GFR, Estimated: >60 mL/min (ref >60)
Glucose, Bld: 99 mg/dL (ref 70–99)
Potassium: 3.8 mmol/L (ref 3.5–5.1)
Sodium: 138 mmol/L (ref 135–145)

## 2022-06-18 LAB — TROPONIN I (HIGH SENSITIVITY): Troponin I (High Sensitivity): 7 ng/L (ref <18)

## 2022-06-18 NOTE — ED Triage Notes (Signed)
Pt to ED via POV for trouble swallowing. Pt has been seen recently for same and told that she needed to have endoscopy done but she was not able to have it done at that time. Pt states that they were supposed to call her and tell her when to come back in but they haven't. Pt states that she is having chest and throat pain as well.   Pt states that she has been NPO since 11 pm last night.

## 2022-06-18 NOTE — ED Provider Notes (Signed)
[facility] Provider Note    Event Date/Time   First MD Initiated Contact with Patient 06/18/22 [phone]     (approximate)   History   Chief Complaint Dysphagia   HPI Patricia Luna is a 61 y.o. female, history of HIV, tobacco use, cocaine use, depression, COPD, anxiety, presents emergency department for evaluation of dysphagia.  Endorses difficulty swallowing solids, particularly meats.  She is able to tolerate liquids.  She states that she was recently admitted on 06/12/2022 where they had originally planned to perform an endoscopic, however she tested positive for cocaine on her UDS and are unable to perform the procedure at that time.  She states that they were supposed to contact her for an outpatient endoscopic, however they reportedly never did.  She states that she has been sober for the past 5 days and should have everything clear from her system.  She presents with her family member, who states that he has ensured that she has not ingested any drugs past week.  Denies fever/chills, chest pain, shortness of breath, nausea, diarrhea, hematemesis, hematochezia, abdominal pain, flank pain, headache, or dysuria.  Per records review, she was seen here initially on 06/11/2022 for the same symptoms.  Dr. Vicente Males from gastroenterology was consulted, who offered endoscopy in the morning following admission, however patient stated that she did not want to be admitted because she needed to check on her cats.  She presented again on 06/12/2022.  She was subsequently admitted and plan to have endoscopic performed, however she tested positive for cocaine on her urine drug screen and was disqualified from endoscopic at that time.  Outpatient follow-up and EGD was set to be arranged per GI consult note, however patient left AMA when she found out that she could not get the endoscopy today.  History Limitations: No limitations.        Physical Exam  Triage Vital Signs: ED  Triage Vitals  Enc Vitals Group     BP 06/18/22 0754 (!) 129/99     Pulse Rate 06/18/22 0754 82     Resp 06/18/22 0754 16     Temp 06/18/22 0754 97.8 F (36.6 C)     Temp Source 06/18/22 0754 Oral     SpO2 06/18/22 0754 97 %     Weight 06/18/22 0755 100 lb (45.4 kg)     Height 06/18/22 0755 5' 3.5" (1.613 m)     Head Circumference --      Peak Flow --      Pain Score 06/18/22 0754 10     Pain Loc --      Pain Edu? --      Excl. in [place]? --     Most recent vital signs: Vitals:   06/18/22 1213 06/18/22 1411  BP: (!) 160/94 (!) 158/84  Pulse: 80 82  Resp: 16 16  Temp: 98.4 F (36.9 C) 98.4 F (36.9 C)  SpO2: 97% 98%    General: Awake, NAD.  Skin: Warm, dry. No rashes or lesions.  Eyes: PERRL. Conjunctivae normal.  CV: Good peripheral perfusion.  Resp: Normal effort.  Abd: Soft, non-tender. No distention.  Neuro: At baseline. No gross neurological deficits.  Musculoskeletal: Normal ROM of all extremities.  Focused Exam: Throat examination unremarkable.  No trismus or drooling.  No tongue/lip swelling.  No peritonsillar abscesses.  Uvula midline.  Physical Exam    ED Results / Procedures / Treatments  Labs (all labs ordered are listed, but only abnormal results are  displayed) Labs Reviewed  BASIC METABOLIC PANEL - Abnormal; Notable for the following components:      Result Value   Calcium 8.8 (*)    All other components within normal limits  CBC - Abnormal; Notable for the following components:   Hemoglobin 11.7 (*)    All other components within normal limits  URINALYSIS, ROUTINE W REFLEX MICROSCOPIC - Abnormal; Notable for the following components:   Color, Urine YELLOW (*)    APPearance CLEAR (*)    Protein, ur 30 (*)    All other components within normal limits  URINE DRUG SCREEN, QUALITATIVE ([facility] ONLY) - Abnormal; Notable for the following components:   Cocaine Metabolite,Ur [place] POSITIVE (*)    All other components within normal limits  TROPONIN I (HIGH  SENSITIVITY)     EKG Sinus rhythm, rate of 81, no segment changes, no AV blocks, normal QRS, no QT prolongation    RADIOLOGY  ED Provider Interpretation: I personally viewed and interpreted this chest x-ray, no evidence of acute abnormalities.  DG ESOPHAGUS W SINGLE CM (SOL OR THIN BA)  Result Date: 06/18/2022 CLINICAL DATA:  Odynophagia, progressive in last 2 weeks, 3 visits to ED in one week EXAM: ESOPHAGUS/BARIUM SWALLOW/TABLET STUDY TECHNIQUE: Single contrast examination was performed using thin liquid barium. This exam was performed by Pasty Spillers, PA-C, and was supervised and interpreted by Sherryl Barters, MD. FLUOROSCOPY: Radiation Exposure Index (as provided by the fluoroscopic device): 8.50 mGy Entrance Dose COMPARISON:  None Available. FINDINGS: Swallowing: Appears normal. No vestibular penetration or aspiration seen. Pharynx: Unremarkable. Esophagus: Unremarkable course and mucosal margin. Esophageal motility: Within normal limits. Hiatal Hernia: None. Gastroesophageal reflux: Seen to the mid/upper esophagus. Ingested 71mm barium tablet: Transient delay at the aortic arch level, subsequently passed into the stomach with serial swallows; no visible stricture in this vicinity. Other: None. IMPRESSION: 1. The only notable observation was transient delay of the barium tablet at the aortic arch level, which cleared with subsequent swallows. No obvious narrowing/stricture in this region aside from the normal extrinsic compression of the aortic arch on the esophagus. Electronically Signed   By: Van Clines M.D.   On: 06/18/2022 12:23   DG Chest 2 View  Result Date: 06/18/2022 CLINICAL DATA:  Chest pain. EXAM: CHEST - 2 VIEW COMPARISON:  Chest x-ray 04/30/2022. FINDINGS: Similar areas of scarring. No consolidation. No visible pleural effusions or pneumothorax. Cardiomediastinal silhouette is unchanged within normal limits. No acute osseous abnormality. IMPRESSION: No active  cardiopulmonary disease. Electronically Signed   By: Margaretha Sheffield M.D.   On: 06/18/2022 08:25    PROCEDURES:  Critical Care performed: N/A.  Procedures    MEDICATIONS ORDERED IN ED: Medications - No data to display   IMPRESSION / MDM / [place] / ED COURSE  I reviewed the triage vital signs and the nursing notes.                              Differential diagnosis includes, but is not limited to, esophageal web, esophageal mass, candidal esophagitis, infectious esophagitis, gastritis  ED Course Patient appears well, vitals within normal limits.  NAD.  CBC shows no leukocytosis.  Mild anemia present 11.7.  BMP shows no AKI or electrolyte abnormalities.  Initial troponin 7, consistent with her previous values.  Urine drug screen positive for cocaine.  Assessment/Plan Patient presents with dysphagia x2 weeks.  She still able to swallow liquids and soft foods.  She  was hoping for admission and endoscopy today, however she did test positive for cocaine again.  Spoke with Dr. Alice Reichert, who recommended barium x-ray to evaluate for any emergent pathology.  X-ray was overall reassuring.  No obvious narrowing/stricture in the region aside from the normal extrinsic compression of the arch on the esophagus.  She appears well clinically.  I do not believe that she needs admission at this time.  Spoke with Dr. Alice Reichert, who states that she can be followed up with on an outpatient basis.  Provide her with the contact information and strongly encouraged her to call today or tomorrow morning to schedule appointment.  Patient expressed understanding and agreed with the plan.  No further work-up or interventions needed at this time.  Will discharge.  Considered admission for this patient, but given her stable presentation and access to follow-up, she is unlikely benefit from admission.  Provided the patient with anticipatory guidance, return precautions, and educational material.  Encouraged the patient to return to the emergency department at any time if they begin to experience any new or worsening symptoms. Patient expressed understanding and agreed with the plan.   Patient's presentation is most consistent with acute complicated illness / injury requiring diagnostic workup.      FINAL CLINICAL IMPRESSION(S) / ED DIAGNOSES   Final diagnoses:  Dysphagia, unspecified type     Rx / DC Orders   ED Discharge Orders     None        Note:  This document was prepared using Dragon voice recognition software and may include unintentional dictation errors.   Teodoro Spray, [place] 06/18/22 1546    Carrie Mew, MD 06/20/22 1911

## 2022-06-18 NOTE — ED Notes (Signed)
Dc instructions and scripts reviewed with pt no questions or concerns at this time will follow up with GI today or tomorrow.,

## 2022-06-18 NOTE — ED Notes (Signed)
18 yof with a c/c of trouble swallowing for the past week. The pt advised she has been seen here twice but did not want to stay due to other obligations.

## 2022-06-18 NOTE — Discharge Instructions (Addendum)
-  Please call Dr. Alice Reichert (gastroenterologist) today or tomorrow morning to schedule an appointment.  Please tell them that you were seen and evaluated here at [facility] emergency department.  -Return to the emergency department anytime if you begin to experience any new or worsening symptoms.

## 2022-06-20 ENCOUNTER — Telehealth: Payer: Self-pay

## 2022-06-20 NOTE — Telephone Encounter (Signed)
        Patient  visited [facility] on 06/11/2022  for dysphagia.   Telephone encounter attempt :  1st attempt  A HIPAA compliant voice message was left requesting a return call.  Instructed patient to call back at [phone] at their earliest convenience.  [facility] management  [place], [place]  Main Phone: [phone]  E-mail: [email or web address]  Website: [email or web address]

## 2022-06-23 ENCOUNTER — Telehealth: Payer: Self-pay

## 2022-06-23 NOTE — Telephone Encounter (Signed)
        Patient  visited [facility] on 06/11/2022  for dysphagia   Telephone encounter attempt :  2nd Attempt  A HIPAA compliant voice message was left requesting a return call.  Instructed patient to call back at [phone] at their earliest convenience.  [place] management  [place], [place]  Main Phone: [phone]  E-mail: [email or web address]  Website: [email or web address]

## 2022-06-24 ENCOUNTER — Telehealth: Payer: Self-pay

## 2022-06-24 NOTE — Telephone Encounter (Signed)
        Patient  visited [facility] on 06/11/2022  for Dysphagia.   Telephone encounter attempt :  3rd Attempt  A HIPAA compliant voice message was left requesting a return call.  Instructed patient to call back at [phone] at their earliest convenience.  Left message 3x times will close follow up.  [place] management  [place], [place]  Main Phone: [phone]  E-mail: [email or web address]  Website: [email or web address]

## 2022-08-01 ENCOUNTER — Encounter: Payer: Self-pay | Admitting: Internal Medicine

## 2022-08-28 ENCOUNTER — Other Ambulatory Visit: Payer: Self-pay | Admitting: Family Medicine

## 2022-08-28 DIAGNOSIS — Z1231 Encounter for screening mammogram for malignant neoplasm of breast: Secondary | ICD-10-CM

## 2022-09-22 ENCOUNTER — Emergency Department
Admission: EM | Admit: 2022-09-22 | Discharge: 2022-09-22 | Disposition: A | Discharge status: Home / Self Care | Payer: Medicare HMO | Attending: Emergency Medicine | Admitting: Emergency Medicine

## 2022-09-22 ENCOUNTER — Emergency Department: Payer: Medicare HMO

## 2022-09-22 DIAGNOSIS — J101 Influenza due to other identified influenza virus with other respiratory manifestations: Secondary | ICD-10-CM | POA: Insufficient documentation

## 2022-09-22 DIAGNOSIS — R079 Chest pain, unspecified: Secondary | ICD-10-CM | POA: Diagnosis present

## 2022-09-22 DIAGNOSIS — Z21 Asymptomatic human immunodeficiency virus [HIV] infection status: Secondary | ICD-10-CM | POA: Insufficient documentation

## 2022-09-22 DIAGNOSIS — J439 Emphysema, unspecified: Secondary | ICD-10-CM

## 2022-09-22 DIAGNOSIS — Z1152 Encounter for screening for COVID-19: Secondary | ICD-10-CM | POA: Insufficient documentation

## 2022-09-22 DIAGNOSIS — J449 Chronic obstructive pulmonary disease, unspecified: Secondary | ICD-10-CM | POA: Diagnosis not present

## 2022-09-22 LAB — TROPONIN I (HIGH SENSITIVITY): Troponin I (High Sensitivity): 8 ng/L (ref <18)

## 2022-09-22 LAB — BASIC METABOLIC PANEL
Anion gap: 8 (ref 5–15)
BUN: 38 mg/dL — ABNORMAL HIGH (ref 8–23)
CO2: 24 mmol/L (ref 22–32)
Calcium: 8.6 mg/dL — ABNORMAL LOW (ref 8.9–10.3)
Chloride: 98 mmol/L (ref 98–111)
Creatinine, Ser: 1.18 mg/dL — ABNORMAL HIGH (ref 0.44–1.00)
GFR, Estimated: 52 mL/min — ABNORMAL LOW (ref >60)
Glucose, Bld: 120 mg/dL — ABNORMAL HIGH (ref 70–99)
Potassium: 3.9 mmol/L (ref 3.5–5.1)
Sodium: 130 mmol/L — ABNORMAL LOW (ref 135–145)

## 2022-09-22 LAB — CBC
HCT: 45.7 % (ref 36.0–46.0)
Hemoglobin: 14.8 g/dL (ref 12.0–15.0)
MCH: 30.3 pg (ref 26.0–34.0)
MCHC: 32.4 g/dL (ref 30.0–36.0)
MCV: 93.5 fL (ref 80.0–100.0)
Platelets: 211 10*3/uL (ref 150–400)
RBC: 4.89 MIL/uL (ref 3.87–5.11)
RDW: 13.3 % (ref 11.5–15.5)
WBC: 6.1 10*3/uL (ref 4.0–10.5)
nRBC: 0 % (ref 0.0–0.2)

## 2022-09-22 LAB — LIPASE, BLOOD: Lipase: 44 U/L (ref 11–51)

## 2022-09-22 LAB — RESP PANEL BY RT-PCR (RSV, FLU A&B, COVID)  RVPGX2
Influenza A by PCR: POSITIVE — AB
Influenza B by PCR: NEGATIVE
Resp Syncytial Virus by PCR: NEGATIVE
SARS Coronavirus 2 by RT PCR: NEGATIVE

## 2022-09-22 LAB — HEPATIC FUNCTION PANEL
ALT: 13 U/L (ref 0–44)
AST: 33 U/L (ref 15–41)
Albumin: 3.8 g/dL (ref 3.5–5.0)
Alkaline Phosphatase: 66 U/L (ref 38–126)
Bilirubin, Direct: <0.1 mg/dL (ref 0.0–0.2)
Total Bilirubin: 0.6 mg/dL (ref 0.3–1.2)
Total Protein: 10.1 g/dL — ABNORMAL HIGH (ref 6.5–8.1)

## 2022-09-22 MED ORDER — ONDANSETRON 4 MG PO TBDP: 4.0000 mg | ORAL_TABLET | Freq: Three times a day (TID) | ORAL | 0 refills | Status: AC | PRN

## 2022-09-22 MED ORDER — BENZONATATE 100 MG PO CAPS: 100.0000 mg | ORAL_CAPSULE | Freq: Three times a day (TID) | ORAL | 0 refills | Status: AC | PRN

## 2022-09-22 NOTE — ED Notes (Signed)
See triage note, pt reports pain all over body since Friday. Denies fevers. NAD noted

## 2022-09-22 NOTE — ED Provider Triage Note (Signed)
Emergency Medicine Provider Triage Evaluation Note  Patricia Luna , a 62 y.o. female  was evaluated in triage.  Pt complains of chest pain, shortness of breath, diarrhea, no vomiting, cough congestion and fever.  Review of Systems  Positive:  Negative:   Physical Exam  Temp 98.5 F (36.9 C) (Oral)  Gen:   Awake, no distress   Resp:  Normal effort  MSK:   Moves extremities without difficulty  Other:    Medical Decision Making  Medically screening exam initiated at 1:54 PM.  Appropriate orders placed.  Scout Bodenheimer was informed that the remainder of the evaluation will be completed by another provider, this initial triage assessment does not replace that evaluation, and the importance of remaining in the ED until their evaluation is complete.     Versie Starks, PA-C 09/22/22 1354

## 2022-09-22 NOTE — ED Provider Notes (Signed)
[facility] Provider Note    Event Date/Time   First MD Initiated Contact with Patient 09/22/22 1608     (approximate)   History   Chief Complaint: Chest Pain   HPI  Patricia Luna is a 62 y.o. female with a history of HIV, GERD, substance abuse, COPD who comes ED complaining of chest pain for the past 4 days, intermittent, hurts with breathing and coughing.  No shortness of breath.  Has some generalized headache and chills.  No vomiting or diarrhea.  Eating and drinking normally.  Has been taking NyQuil and cough drops at home with some temporary relief of symptoms.     Physical Exam   Triage Vital Signs: ED Triage Vitals  Enc Vitals Group     BP 09/22/22 1354 97/66     Pulse Rate 09/22/22 1354 (!) 103     Resp 09/22/22 1354 18     Temp 09/22/22 1350 98.5 F (36.9 C)     Temp Source 09/22/22 1350 Oral     SpO2 09/22/22 1354 99 %     Weight 09/22/22 1355 99 lb 3.3 oz (45 kg)     Height 09/22/22 1355 5\' 3"  (1.6 m)     Head Circumference --      Peak Flow --      Pain Score 09/22/22 1355 10     Pain Loc --      Pain Edu? --      Excl. in [place]? --     Most recent vital signs: Vitals:   09/22/22 1354 09/22/22 1607  BP: 97/66 130/73  Pulse: (!) 103   Resp: 18 (!) 22  Temp:    SpO2: 99% 100%    General: Awake, no distress.  CV:  Good peripheral perfusion.  Tachycardia heart rate 100 Resp:  Normal effort.  Clear to auscultation bilaterally.  Normal expiratory phase Abd:  No distention.  Soft nontender Other:  Moist oral mucosa   ED Results / Procedures / Treatments   Labs (all labs ordered are listed, but only abnormal results are displayed) Labs Reviewed  RESP PANEL BY RT-PCR (RSV, FLU A&B, COVID)  RVPGX2 - Abnormal; Notable for the following components:      Result Value   Influenza A by PCR POSITIVE (*)    All other components within normal limits  BASIC METABOLIC PANEL - Abnormal; Notable for the following components:   Sodium  130 (*)    Glucose, Bld 120 (*)    BUN 38 (*)    Creatinine, Ser 1.18 (*)    Calcium 8.6 (*)    GFR, Estimated 52 (*)    All other components within normal limits  HEPATIC FUNCTION PANEL - Abnormal; Notable for the following components:   Total Protein 10.1 (*)    All other components within normal limits  CBC  LIPASE, BLOOD  TROPONIN I (HIGH SENSITIVITY)  TROPONIN I (HIGH SENSITIVITY)     EKG Interpreted by me Sinus tachycardia rate 105.  Normal axis, normal intervals.  Normal QRS ST segments and T waves.  No ischemic changes.   RADIOLOGY Chest x-ray interpreted by me, consistent with emphysema, no acute findings.  Radiology report reviewed   PROCEDURES:  Procedures   MEDICATIONS ORDERED IN ED: Medications - No data to display   IMPRESSION / MDM / [place] / ED COURSE  I reviewed the triage vital signs and the nursing notes.  Differential diagnosis includes, but is not limited to, viral illness, pneumonia, pleural effusion, pulmonary edema, dehydration, AKI, anemia, doubt PE ACS or dissection  Patient's presentation is most consistent with acute presentation with potential threat to life or bodily function.  Patient presents with constitutional symptoms consistent with a viral illness.  Chest pain that I think is due to pleurisy.  Viral swab is positive for influenza A.  Other labs are all unremarkable, troponin negative.  Chest x-ray and EKG unremarkable.  She has a mild tachycardia which I suspect is due to some degree of dehydration, but she is tolerating oral intake and nontoxic.  Will prescribe Tessalon, Zofran, counseled her to focus on hydration and supportive care at home.  She does not require admission.       FINAL CLINICAL IMPRESSION(S) / ED DIAGNOSES   Final diagnoses:  Influenza A  Pulmonary emphysema, unspecified emphysema type ([place])     Rx / DC Orders   ED Discharge Orders          Ordered     ondansetron (ZOFRAN-ODT) 4 MG disintegrating tablet  Every 8 hours PRN        09/22/22 1614    benzonatate (TESSALON PERLES) 100 MG capsule  3 times daily PRN        09/22/22 1614             Note:  This document was prepared using Dragon voice recognition software and may include unintentional dictation errors.   Carrie Mew, MD 09/22/22 [phone]

## 2022-09-22 NOTE — ED Triage Notes (Signed)
Pt presents to the ED via POV due to chest pain that started Friday and gotten worse today. Pt denies SOB, and NV. Pt states when she breaths it hurts. Pt A&Ox4

## 2022-10-10 ENCOUNTER — Other Ambulatory Visit: Payer: Self-pay | Admitting: Nurse Practitioner

## 2022-10-10 DIAGNOSIS — J069 Acute upper respiratory infection, unspecified: Secondary | ICD-10-CM

## 2022-11-20 ENCOUNTER — Telehealth: Payer: Self-pay | Admitting: *Deleted

## 2022-11-20 NOTE — Telephone Encounter (Signed)
Attempted to call patient regarding referral for Lung Screening program from Howard Pouch, NP. Fast busy at first number listed and no answer or voicemail at second number listed. Will mail letter to patients home regarding referral.

## 2023-12-15 ENCOUNTER — Encounter: Payer: Self-pay | Admitting: *Deleted

## 2024-02-08 ENCOUNTER — Emergency Department
Admission: EM | Admit: 2024-02-08 | Discharge: 2024-02-08 | Disposition: A | Discharge status: Home / Self Care | Payer: Medicare (Managed Care) | Attending: Emergency Medicine | Admitting: Emergency Medicine

## 2024-02-08 ENCOUNTER — Other Ambulatory Visit: Payer: Self-pay

## 2024-02-08 ENCOUNTER — Emergency Department: Payer: Medicare (Managed Care)

## 2024-02-08 ENCOUNTER — Encounter: Payer: Self-pay | Admitting: Emergency Medicine

## 2024-02-08 DIAGNOSIS — J45909 Unspecified asthma, uncomplicated: Secondary | ICD-10-CM | POA: Insufficient documentation

## 2024-02-08 DIAGNOSIS — S00511A Abrasion of lip, initial encounter: Secondary | ICD-10-CM | POA: Diagnosis not present

## 2024-02-08 DIAGNOSIS — Z21 Asymptomatic human immunodeficiency virus [HIV] infection status: Secondary | ICD-10-CM | POA: Diagnosis not present

## 2024-02-08 DIAGNOSIS — F17210 Nicotine dependence, cigarettes, uncomplicated: Secondary | ICD-10-CM | POA: Diagnosis not present

## 2024-02-08 DIAGNOSIS — J181 Lobar pneumonia, unspecified organism: Secondary | ICD-10-CM | POA: Insufficient documentation

## 2024-02-08 DIAGNOSIS — R059 Cough, unspecified: Secondary | ICD-10-CM | POA: Diagnosis not present

## 2024-02-08 DIAGNOSIS — S0993XA Unspecified injury of face, initial encounter: Secondary | ICD-10-CM | POA: Diagnosis present

## 2024-02-08 DIAGNOSIS — J189 Pneumonia, unspecified organism: Secondary | ICD-10-CM

## 2024-02-08 LAB — RESP PANEL BY RT-PCR (RSV, FLU A&B, COVID)  RVPGX2
Influenza A by PCR: NEGATIVE
Influenza B by PCR: NEGATIVE
Resp Syncytial Virus by PCR: NEGATIVE
SARS Coronavirus 2 by RT PCR: NEGATIVE

## 2024-02-08 MED ORDER — DOXYCYCLINE HYCLATE 100 MG PO TABS: 100.0000 mg | ORAL_TABLET | Freq: Two times a day (BID) | ORAL | 0 refills | Status: AC

## 2024-02-08 MED ORDER — ALBUTEROL SULFATE HFA 108 (90 BASE) MCG/ACT IN AERS: 1.0000 | INHALATION_SPRAY | Freq: Four times a day (QID) | RESPIRATORY_TRACT | 0 refills | Status: AC | PRN

## 2024-02-08 MED ORDER — AMOXICILLIN-POT CLAVULANATE 875-125 MG PO TABS: 1.0000 | ORAL_TABLET | Freq: Two times a day (BID) | ORAL | 0 refills | Status: AC
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
## 2024-02-08 NOTE — Discharge Instructions (Addendum)
We believe that your symptoms are caused today by pneumonia, an infection in your lung(s).  Fortunately you should start to improve quickly after taking your antibiotics.  Please take the full course of antibiotics as prescribed and drink plenty of fluids.   ° °Follow up with your doctor within 1-2 days.  If you develop any new or worsening symptoms, including but not limited to fever in spite of taking over-the-counter ibuprofen and/or Tylenol, persistent vomiting, worsening shortness of breath, or other symptoms that concern you, please return to the Emergency Department immediately.  ° ° °Pneumonia °Pneumonia is an infection of the lungs.  °CAUSES °Pneumonia may be caused by bacteria or a virus. Usually, these infections are caused by breathing infectious particles into the lungs (respiratory tract). °SIGNS AND SYMPTOMS  °Cough. °Fever. °Chest pain. °Increased rate of breathing. °Wheezing. °Mucus production. °DIAGNOSIS  °If you have the common symptoms of pneumonia, your health care provider will typically confirm the diagnosis with a chest X-ray. The X-ray will show an abnormality in the lung (pulmonary infiltrate) if you have pneumonia. Other tests of your blood, urine, or sputum may be done to find the specific cause of your pneumonia. Your health care provider may also do tests (blood gases or pulse oximetry) to see how well your lungs are working. °TREATMENT  °Some forms of pneumonia may be spread to other people when you cough or sneeze. You may be asked to wear a mask before and during your exam. Pneumonia that is caused by bacteria is treated with antibiotic medicine. Pneumonia that is caused by the influenza virus may be treated with an antiviral medicine. Most other viral infections must run their course. These infections will not respond to antibiotics.  °HOME CARE INSTRUCTIONS  °Cough suppressants may be used if you are losing too much rest. However, coughing protects you by clearing your lungs. You  should avoid using cough suppressants if you can. °Your health care provider may have prescribed medicine if he or she thinks your pneumonia is caused by bacteria or influenza. Finish your medicine even if you start to feel better. °Your health care provider may also prescribe an expectorant. This loosens the mucus to be coughed up. °Take medicines only as directed by your health care provider. °Do not smoke. Smoking is a common cause of bronchitis and can contribute to pneumonia. If you are a smoker and continue to smoke, your cough may last several weeks after your pneumonia has cleared. °A cold steam vaporizer or humidifier in your room or home may help loosen mucus. °Coughing is often worse at night. Sleeping in a semi-upright position in a recliner or using a couple pillows under your head will help with this. °Get rest as you feel it is needed. Your body will usually let you know when you need to rest. °PREVENTION °A pneumococcal shot (vaccine) is available to prevent a common bacterial cause of pneumonia. This is usually suggested for: °People over 65 years old. °Patients on chemotherapy. °People with chronic lung problems, such as bronchitis or emphysema. °People with immune system problems. °If you are over 65 or have a high risk condition, you may receive the pneumococcal vaccine if you have not received it before. In some countries, a routine influenza vaccine is also recommended. This vaccine can help prevent some cases of pneumonia. You may be offered the influenza vaccine as part of your care. °If you smoke, it is time to quit. You may receive instructions on how to stop smoking. Your   health care provider can provide medicines and counseling to help you quit. °SEEK MEDICAL CARE IF: °You have a fever. °SEEK IMMEDIATE MEDICAL CARE IF:  °Your illness becomes worse. This is especially true if you are elderly or weakened from any other disease. °You cannot control your cough with suppressants and are losing  sleep. °You begin coughing up blood. °You develop pain which is getting worse or is uncontrolled with medicines. °Any of the symptoms which initially brought you in for treatment are getting worse rather than better. °You develop shortness of breath or chest pain. °MAKE SURE YOU:  °Understand these instructions. °Will watch your condition. °Will get help right away if you are not doing well or get worse. °Document Released: 09/08/2005 Document Revised: 01/23/2014 Document Reviewed: 11/28/2010 °[employer]® Patient Information ©2015 [employer]. This information is not intended to replace advice given to you by your health care provider. Make sure you discuss any questions you have with your health care provider. ° ° ° °

## 2024-06-28 ENCOUNTER — Other Ambulatory Visit: Payer: Self-pay

## 2024-06-28 ENCOUNTER — Emergency Department
Admission: EM | Admit: 2024-06-28 | Discharge: 2024-06-28 | Disposition: A | Discharge status: Home / Self Care | Payer: Medicare (Managed Care) | Attending: Emergency Medicine | Admitting: Emergency Medicine

## 2024-06-28 DIAGNOSIS — Z21 Asymptomatic human immunodeficiency virus [HIV] infection status: Secondary | ICD-10-CM | POA: Insufficient documentation

## 2024-06-28 DIAGNOSIS — R21 Rash and other nonspecific skin eruption: Secondary | ICD-10-CM | POA: Diagnosis not present

## 2024-06-28 DIAGNOSIS — M79644 Pain in right finger(s): Secondary | ICD-10-CM | POA: Insufficient documentation

## 2024-06-28 DIAGNOSIS — M62830 Muscle spasm of back: Secondary | ICD-10-CM | POA: Diagnosis not present

## 2024-06-28 DIAGNOSIS — M545 Low back pain, unspecified: Secondary | ICD-10-CM | POA: Diagnosis present

## 2024-06-28 MED ORDER — KETOROLAC TROMETHAMINE 15 MG/ML IJ SOLN
15.0000 mg | Freq: Once | INTRAMUSCULAR | Status: AC
  Administered 2024-06-28: 15 mg via INTRAMUSCULAR
  Filled 2024-06-28: qty 1

## 2024-06-28 MED ORDER — LIDOCAINE 5 % EX PTCH
1.0000 | MEDICATED_PATCH | CUTANEOUS | Status: DC
  Administered 2024-06-28: 1 via TRANSDERMAL
  Filled 2024-06-28: qty 1

## 2024-06-28 MED ORDER — CEPHALEXIN 500 MG PO CAPS: 500.0000 mg | ORAL_CAPSULE | Freq: Two times a day (BID) | ORAL | 0 refills | Status: AC

## 2024-06-28 MED ORDER — ACETAMINOPHEN 325 MG PO TABS
650.0000 mg | ORAL_TABLET | Freq: Once | ORAL | Status: AC
  Administered 2024-06-28: 650 mg via ORAL
  Filled 2024-06-28: qty 2

## 2024-06-28 NOTE — ED Provider Notes (Signed)
 [facility] Emergency Department Provider Note     Event Date/Time   First MD Initiated Contact with Patient 06/28/24 1211     (approximate)   History   Back Pain   HPI  Patricia Luna is a 63 y.o. female with a past medical history of HIV and anxiety presents to the ED with multiple complaints. 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  No leg weakness, saddle anesthesia or loss of bowel or bladder control.  Denies urinary symptoms.  Patient also notes painful and swollen right index finger.  Patient believes she may have had a splinter in her finger. Denies bleeding, trauma, injury, or drainage.   Patient also notes bilateral elbow rash, pruritic in nature.   Physical Exam   Triage Vital Signs: ED Triage Vitals  Encounter Vitals Group     BP 06/28/24 1208 (!) 147/82     Girls Systolic BP Percentile --      Girls Diastolic BP Percentile --      Boys Systolic BP Percentile --      Boys Diastolic BP Percentile --      Pulse Rate 06/28/24 1207 94     Resp 06/28/24 1207 18     Temp 06/28/24 1207 98.4 F (36.9 C)     Temp Source 06/28/24 1207 Oral     SpO2 06/28/24 1207 99 %     Weight --      Height --      Head Circumference --      Peak Flow --      Pain Score 06/28/24 1207 9     Pain Loc --      Pain Education --      Exclude from Growth Chart --     Most recent vital signs: Vitals:   06/28/24 1207 06/28/24 1208  BP:  (!) 147/82  Pulse: 94   Resp: 18   Temp: 98.4 F (36.9 C)   SpO2: 99%    General: Well appearing and comfortable. Alert and oriented. INAD.  Skin:  Dry, grouped, papule, scab-like rash on bilateral elbows.      Head:  NCAT.  CV:  Good peripheral perfusion. RESP:  Normal effort.  BACK:  Spinous process is midline without deformity or tenderness. Mild tenderness to palpation to right lumbar paraspinal muscles.  Negative CVA tenderness bilaterally.  NEURO: Cranial nerves intact. No focal deficits. Speech is clear. Sensation and motor function intact. Normal muscle strength of UE & LE. Gait is steady.  OTHER:  Right index finger is swollen on finger pad compared to left. No nail involvement. No fluctuance or foreign body. Moderate tenderness to palpation.    ED Results / Procedures / Treatments   Labs (all labs ordered are listed, but only abnormal results are displayed) Labs Reviewed - No data to display  No results found.  PROCEDURES:  Critical Care performed: No  Procedures  MEDICATIONS ORDERED IN ED: Medications  lidocaine  (LIDODERM ) 5 % 1 patch (1 patch Transdermal Patch Applied 06/28/24 1324)  ketorolac  (TORADOL ) 15 MG/ML injection 15 mg (15 mg Intramuscular Given 06/28/24 1325)  acetaminophen  (TYLENOL ) tablet 650 mg (650 mg Oral Given 06/28/24 1325)    IMPRESSION / MDM / ASSESSMENT AND PLAN / ED COURSE  I reviewed the triage vital signs and the nursing notes.  63 y.o. female presents to the emergency department for evaluation and treatment of acute nontraumatic back pain, rash, nontraumatic finger pain. See HPI for further details.   Differential diagnosis includes, but is not limited to strain, spasm, felon, paronychia, contact dermatitis, eczema, xeroderma  Patient's presentation is most consistent with acute, uncomplicated illness.  Patient is alert and oriented.  She is hemodynamic stable.  Physical exam findings are stated above.  No indication for further workup at this time.  Will treat back pain and which I believe is muscular etiology with Toradol  and Tylenol  and lidocaine  patch.   Presentation of finger is suspicious for early development of a felon.  There area or indication for incision and drainage at this time.  Advised warm compress and close monitoring at home.  Will send short course antibiotic to pharmacy.  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  No signs of infection.  Advised moisturizing cream such as Aquaphor and CeraVe a over the area with a barrier ointment such as Vaseline.  The patient is in stable and satisfactory condition for discharge home. Encouraged to follow up with primary care provider for further management. ED precautions discussed. All questions and concerns were addressed during this ED visit.     FINAL CLINICAL IMPRESSION(S) / ED DIAGNOSES   Final diagnoses:  Muscle spasm of back  Finger pain, right  Rash   Rx / DC Orders   ED Discharge Orders          Ordered    cephALEXin (KEFLEX) 500 MG capsule  2 times daily        06/28/24 1305             Note:  This document was prepared using Dragon voice recognition software and may include unintentional dictation errors.    Margrette, Gray Maugeri A, PA-C 06/28/24 1552    Arlander Charleston, MD 06/29/24 1031

## 2024-06-28 NOTE — Discharge Instructions (Addendum)
 Your evaluated in the ED for multiple complaints.  Your physical exam findings for your back pain are reassuring.   Your finger pain appears consistent to a possible early development of a felon (finger infection).  Please apply warm compresses to the area.  We have sent antibiotics to your pharmacy.  Follow-up with your primary care provider in 1 week for further management.  For the rash on your elbows, after your shower apply CeraVe, or Aquaphor cream and then apply Vaseline as a barrier.  Do this twice daily.  You can also apply Benadryl  cream to help with itching.

## 2024-06-28 NOTE — ED Triage Notes (Signed)
 Patient states low back pain for two weeks.

## 2024-10-03 ENCOUNTER — Emergency Department

## 2024-10-03 ENCOUNTER — Emergency Department
Admission: EM | Admit: 2024-10-03 | Discharge: 2024-10-03 | Disposition: A | Discharge status: Home / Self Care | Attending: Emergency Medicine | Admitting: Emergency Medicine

## 2024-10-03 ENCOUNTER — Other Ambulatory Visit: Payer: Self-pay

## 2024-10-03 DIAGNOSIS — J449 Chronic obstructive pulmonary disease, unspecified: Secondary | ICD-10-CM | POA: Insufficient documentation

## 2024-10-03 DIAGNOSIS — J069 Acute upper respiratory infection, unspecified: Secondary | ICD-10-CM | POA: Insufficient documentation

## 2024-10-03 DIAGNOSIS — E876 Hypokalemia: Secondary | ICD-10-CM | POA: Diagnosis not present

## 2024-10-03 DIAGNOSIS — R051 Acute cough: Secondary | ICD-10-CM

## 2024-10-03 DIAGNOSIS — Z21 Asymptomatic human immunodeficiency virus [HIV] infection status: Secondary | ICD-10-CM | POA: Diagnosis not present

## 2024-10-03 LAB — BASIC METABOLIC PANEL WITH GFR
Anion gap: 6 (ref 5–15)
BUN: 9 mg/dL (ref 8–23)
CO2: 28 mmol/L (ref 22–32)
Calcium: 8.7 mg/dL — ABNORMAL LOW (ref 8.9–10.3)
Chloride: 104 mmol/L (ref 98–111)
Creatinine, Ser: 0.53 mg/dL (ref 0.44–1.00)
GFR, Estimated: >60 mL/min
Glucose, Bld: 91 mg/dL (ref 70–99)
Potassium: 3.3 mmol/L — ABNORMAL LOW (ref 3.5–5.1)
Sodium: 138 mmol/L (ref 135–145)

## 2024-10-03 LAB — CBC
HCT: 34.7 % — ABNORMAL LOW (ref 36.0–46.0)
Hemoglobin: 11.3 g/dL — ABNORMAL LOW (ref 12.0–15.0)
MCH: 29.9 pg (ref 26.0–34.0)
MCHC: 32.6 g/dL (ref 30.0–36.0)
MCV: 91.8 fL (ref 80.0–100.0)
Platelets: 239 K/uL (ref 150–400)
RBC: 3.78 MIL/uL — ABNORMAL LOW (ref 3.87–5.11)
RDW: 12.9 % (ref 11.5–15.5)
WBC: 4.8 K/uL (ref 4.0–10.5)
nRBC: 0 % (ref 0.0–0.2)

## 2024-10-03 LAB — TROPONIN T, HIGH SENSITIVITY: Troponin T High Sensitivity: <15 ng/L (ref 0–19)

## 2024-10-03 NOTE — ED Triage Notes (Signed)
 Pt to ED for chest pain, cough, rash to hands started yesterday. NAD noted. Speaking complete sentences. Last used crack 4 days ago. ETOH 2 days ago

## 2024-10-03 NOTE — ED Provider Notes (Signed)
 "  [facility] Provider Note    Event Date/Time   First MD Initiated Contact with Patient 10/03/24 1027     (approximate)   History   Cough, chest discomfort   HPI  Patricia Luna is a 64 y.o. female with history of HIV, COPD, substance abuse, hard of hearing who presents with complaints of cough which has been ongoing for several days.  She reports runny nose as well.  Denies shortness of breath to me.  No fevers noted     Physical Exam   Triage Vital Signs: ED Triage Vitals  Encounter Vitals Group     BP 10/03/24 1016 (!) 155/92     Girls Systolic BP Percentile --      Girls Diastolic BP Percentile --      Boys Systolic BP Percentile --      Boys Diastolic BP Percentile --      Pulse Rate 10/03/24 1016 81     Resp 10/03/24 1016 20     Temp 10/03/24 1018 98.3 F (36.8 C)     Temp src --      SpO2 10/03/24 1016 95 %     Weight 10/03/24 1018 44.9 kg (99 lb)     Height 10/03/24 1018 1.613 m (5' 3.5)     Head Circumference --      Peak Flow --      Pain Score 10/03/24 1018 5     Pain Loc --      Pain Education --      Exclude from Growth Chart --     Most recent vital signs: Vitals:   10/03/24 1016 10/03/24 1018  BP: (!) 155/92   Pulse: 81   Resp: 20   Temp:  98.3 F (36.8 C)  SpO2: 95%      General: Awake, no distress.  CV:  Good peripheral perfusion.  Resp:  Normal effort.  Overall clear to auscultation, scattered mild wheezes likely chronic Abd:  No distention.  Other:     ED Results / Procedures / Treatments   Labs (all labs ordered are listed, but only abnormal results are displayed) Labs Reviewed  BASIC METABOLIC PANEL WITH GFR - Abnormal; Notable for the following components:      Result Value   Potassium 3.3 (*)    Calcium 8.7 (*)    All other components within normal limits  CBC - Abnormal; Notable for the following components:   RBC 3.78 (*)    Hemoglobin 11.3 (*)    HCT 34.7 (*)    All other components  within normal limits  TROPONIN T, HIGH SENSITIVITY     EKG  ED ECG REPORT I, Lamar Price, the attending physician, personally viewed and interpreted this ECG.  Date: 10/03/2024  Rhythm: normal sinus rhythm QRS Axis: normal Intervals: normal ST/T Wave abnormalities: normal Narrative Interpretation: no evidence of acute ischemia    RADIOLOGY Chest x-ray viewed interpret by me, no acute abnormality    PROCEDURES:  Critical Care performed:   Procedures   MEDICATIONS ORDERED IN ED: Medications - No data to display   IMPRESSION / MDM / ASSESSMENT AND PLAN / ED COURSE  I reviewed the triage vital signs and the nursing notes. Patient's presentation is most consistent with acute presentation with potential threat to life or bodily function.  Patient presents with cough, chest discomfort in the setting of a history of HIV.  Overall she is well-appearing and in no acute distress and symptoms  appear consistent with upper respiratory infection, likely viral although pneumonia is on the differential.  Will obtain x-ray, labs and reevaluate  X-ray is negative for pneumonia, lab work is reassuring, normal high sensitive troponin.  Recommend supportive care, no indication for admission at this time, appropriate for discharge        FINAL CLINICAL IMPRESSION(S) / ED DIAGNOSES   Final diagnoses:  Acute cough  Upper respiratory tract infection, unspecified type     Rx / DC Orders   ED Discharge Orders     None        Note:  This document was prepared using Dragon voice recognition software and may include unintentional dictation errors.   Arlander Charleston, MD 10/03/24 1334  "

## 2024-10-03 NOTE — ED Notes (Signed)
 Pt going to xray prior to being roomed
